# Patient Record
Sex: Female | Born: 1955
Health system: Southern US, Community
[De-identification: ages and names within clinical notes are randomized; demographics above are authoritative.]

## PROBLEM LIST (undated history)

## (undated) DIAGNOSIS — C801 Malignant (primary) neoplasm, unspecified: Secondary | ICD-10-CM

## (undated) DIAGNOSIS — M8580 Other specified disorders of bone density and structure, unspecified site: Secondary | ICD-10-CM

## (undated) DIAGNOSIS — T7840XA Allergy, unspecified, initial encounter: Secondary | ICD-10-CM

## (undated) DIAGNOSIS — E7800 Pure hypercholesterolemia, unspecified: Secondary | ICD-10-CM

## (undated) DIAGNOSIS — I10 Essential (primary) hypertension: Secondary | ICD-10-CM

## (undated) HISTORY — DX: Allergy, unspecified, initial encounter: T78.40XA

## (undated) HISTORY — DX: Essential (primary) hypertension: I10

## (undated) HISTORY — DX: Malignant (primary) neoplasm, unspecified: C80.1

## (undated) HISTORY — DX: Pure hypercholesterolemia, unspecified: E78.00

## (undated) HISTORY — DX: Other specified disorders of bone density and structure, unspecified site: M85.80

## (undated) HISTORY — PX: OTHER SURGICAL HISTORY: SHX169

## (undated) HISTORY — PX: TONSILLECTOMY: SUR1361

---

## 1998-10-30 ENCOUNTER — Other Ambulatory Visit: Admission: RE | Admit: 1998-10-30 | Discharge: 1998-10-30 | Payer: Self-pay | Admitting: Gynecology

## 1999-06-17 HISTORY — PX: CERVICAL BIOPSY  W/ LOOP ELECTRODE EXCISION: SUR135

## 1999-11-26 ENCOUNTER — Other Ambulatory Visit: Admission: RE | Admit: 1999-11-26 | Discharge: 1999-11-26 | Payer: Self-pay | Admitting: Gynecology

## 1999-12-20 ENCOUNTER — Other Ambulatory Visit: Admission: RE | Admit: 1999-12-20 | Discharge: 1999-12-20 | Payer: Self-pay | Admitting: Gynecology

## 1999-12-20 ENCOUNTER — Encounter (INDEPENDENT_AMBULATORY_CARE_PROVIDER_SITE_OTHER): Payer: Self-pay

## 2000-07-29 ENCOUNTER — Ambulatory Visit (HOSPITAL_COMMUNITY): Admission: RE | Admit: 2000-07-29 | Discharge: 2000-07-29 | Payer: Self-pay | Admitting: Family Medicine

## 2000-07-29 ENCOUNTER — Encounter: Payer: Self-pay | Admitting: Family Medicine

## 2000-12-04 ENCOUNTER — Other Ambulatory Visit: Admission: RE | Admit: 2000-12-04 | Discharge: 2000-12-04 | Payer: Self-pay | Admitting: Gynecology

## 2001-10-08 ENCOUNTER — Encounter: Payer: Self-pay | Admitting: Gynecology

## 2001-10-08 ENCOUNTER — Ambulatory Visit (HOSPITAL_COMMUNITY): Admission: RE | Admit: 2001-10-08 | Discharge: 2001-10-08 | Payer: Self-pay | Admitting: Gynecology

## 2002-05-23 ENCOUNTER — Other Ambulatory Visit: Admission: RE | Admit: 2002-05-23 | Discharge: 2002-05-23 | Payer: Self-pay | Admitting: Gynecology

## 2002-11-25 ENCOUNTER — Encounter: Payer: Self-pay | Admitting: Gynecology

## 2002-11-25 ENCOUNTER — Encounter: Admission: RE | Admit: 2002-11-25 | Discharge: 2002-11-25 | Payer: Self-pay | Admitting: Gynecology

## 2004-04-04 ENCOUNTER — Encounter: Admission: RE | Admit: 2004-04-04 | Discharge: 2004-04-04 | Payer: Self-pay | Admitting: Gynecology

## 2004-04-18 ENCOUNTER — Other Ambulatory Visit: Admission: RE | Admit: 2004-04-18 | Discharge: 2004-04-18 | Payer: Self-pay | Admitting: Gynecology

## 2005-04-29 ENCOUNTER — Other Ambulatory Visit: Admission: RE | Admit: 2005-04-29 | Discharge: 2005-04-29 | Payer: Self-pay | Admitting: Gynecology

## 2005-05-05 ENCOUNTER — Encounter: Admission: RE | Admit: 2005-05-05 | Discharge: 2005-05-05 | Payer: Self-pay | Admitting: Gynecology

## 2005-05-26 ENCOUNTER — Encounter: Admission: RE | Admit: 2005-05-26 | Discharge: 2005-05-26 | Payer: Self-pay | Admitting: Gynecology

## 2006-08-11 ENCOUNTER — Encounter: Admission: RE | Admit: 2006-08-11 | Discharge: 2006-08-11 | Payer: Self-pay | Admitting: Gynecology

## 2006-08-25 ENCOUNTER — Other Ambulatory Visit: Admission: RE | Admit: 2006-08-25 | Discharge: 2006-08-25 | Payer: Self-pay | Admitting: Gynecology

## 2007-11-05 ENCOUNTER — Encounter: Admission: RE | Admit: 2007-11-05 | Discharge: 2007-11-05 | Payer: Self-pay | Admitting: Gynecology

## 2007-11-18 ENCOUNTER — Other Ambulatory Visit: Admission: RE | Admit: 2007-11-18 | Discharge: 2007-11-18 | Payer: Self-pay | Admitting: Gynecology

## 2008-05-15 ENCOUNTER — Ambulatory Visit: Payer: Self-pay | Admitting: Internal Medicine

## 2008-05-25 ENCOUNTER — Encounter: Payer: Self-pay | Admitting: Internal Medicine

## 2008-05-25 ENCOUNTER — Ambulatory Visit: Payer: Self-pay | Admitting: Internal Medicine

## 2008-05-29 ENCOUNTER — Encounter: Payer: Self-pay | Admitting: Internal Medicine

## 2008-11-20 ENCOUNTER — Encounter: Admission: RE | Admit: 2008-11-20 | Discharge: 2008-11-20 | Payer: Self-pay | Admitting: Gynecology

## 2008-12-27 ENCOUNTER — Encounter: Payer: Self-pay | Admitting: Gynecology

## 2008-12-27 ENCOUNTER — Other Ambulatory Visit: Admission: RE | Admit: 2008-12-27 | Discharge: 2008-12-27 | Payer: Self-pay | Admitting: Gynecology

## 2008-12-27 ENCOUNTER — Ambulatory Visit: Payer: Self-pay | Admitting: Gynecology

## 2008-12-29 ENCOUNTER — Ambulatory Visit: Payer: Self-pay | Admitting: Gynecology

## 2009-11-27 ENCOUNTER — Encounter: Admission: RE | Admit: 2009-11-27 | Discharge: 2009-11-27 | Payer: Self-pay | Admitting: Gynecology

## 2009-12-28 ENCOUNTER — Ambulatory Visit: Payer: Self-pay | Admitting: Gynecology

## 2009-12-28 ENCOUNTER — Other Ambulatory Visit: Admission: RE | Admit: 2009-12-28 | Discharge: 2009-12-28 | Payer: Self-pay | Admitting: Gynecology

## 2010-07-07 ENCOUNTER — Encounter: Payer: Self-pay | Admitting: Gynecology

## 2010-11-13 ENCOUNTER — Other Ambulatory Visit: Payer: Self-pay | Admitting: Gynecology

## 2010-11-13 DIAGNOSIS — Z1231 Encounter for screening mammogram for malignant neoplasm of breast: Secondary | ICD-10-CM

## 2010-11-29 ENCOUNTER — Ambulatory Visit
Admission: RE | Admit: 2010-11-29 | Discharge: 2010-11-29 | Disposition: A | Discharge status: Home / Self Care | Payer: BC Managed Care – PPO | Source: Ambulatory Visit | Attending: Gynecology | Admitting: Gynecology

## 2010-11-29 DIAGNOSIS — Z1231 Encounter for screening mammogram for malignant neoplasm of breast: Secondary | ICD-10-CM

## 2010-12-30 ENCOUNTER — Encounter (INDEPENDENT_AMBULATORY_CARE_PROVIDER_SITE_OTHER): Payer: 59 | Admitting: Gynecology

## 2010-12-30 ENCOUNTER — Other Ambulatory Visit (HOSPITAL_COMMUNITY)
Admission: RE | Admit: 2010-12-30 | Discharge: 2010-12-30 | Disposition: A | Discharge status: Home / Self Care | Payer: 59 | Source: Ambulatory Visit | Attending: Gynecology | Admitting: Gynecology

## 2010-12-30 ENCOUNTER — Other Ambulatory Visit: Payer: Self-pay | Admitting: Gynecology

## 2010-12-30 DIAGNOSIS — IMO0002 Reserved for concepts with insufficient information to code with codable children: Secondary | ICD-10-CM

## 2010-12-30 DIAGNOSIS — Z124 Encounter for screening for malignant neoplasm of cervix: Secondary | ICD-10-CM | POA: Insufficient documentation

## 2010-12-30 DIAGNOSIS — Z01419 Encounter for gynecological examination (general) (routine) without abnormal findings: Secondary | ICD-10-CM

## 2011-11-04 ENCOUNTER — Other Ambulatory Visit: Payer: Self-pay | Admitting: Gynecology

## 2011-11-04 DIAGNOSIS — Z1231 Encounter for screening mammogram for malignant neoplasm of breast: Secondary | ICD-10-CM

## 2011-12-09 ENCOUNTER — Ambulatory Visit
Admission: RE | Admit: 2011-12-09 | Discharge: 2011-12-09 | Disposition: A | Discharge status: Home / Self Care | Payer: 59 | Source: Ambulatory Visit | Attending: Gynecology | Admitting: Gynecology

## 2011-12-09 DIAGNOSIS — Z1231 Encounter for screening mammogram for malignant neoplasm of breast: Secondary | ICD-10-CM

## 2012-02-17 ENCOUNTER — Encounter: Payer: Self-pay | Admitting: Gynecology

## 2012-02-17 ENCOUNTER — Ambulatory Visit (INDEPENDENT_AMBULATORY_CARE_PROVIDER_SITE_OTHER): Payer: PRIVATE HEALTH INSURANCE | Admitting: Gynecology

## 2012-02-17 VITALS — BP 128/74 | Ht 62.0 in | Wt 131.0 lb

## 2012-02-17 DIAGNOSIS — N926 Irregular menstruation, unspecified: Secondary | ICD-10-CM

## 2012-02-17 DIAGNOSIS — N951 Menopausal and female climacteric states: Secondary | ICD-10-CM

## 2012-02-17 DIAGNOSIS — Z01419 Encounter for gynecological examination (general) (routine) without abnormal findings: Secondary | ICD-10-CM

## 2012-02-17 NOTE — Patient Instructions (Signed)
Continue with over-the-counter soy for hot flashes. If become significantly bothersome follow up for hormone replacement discussion. Otherwise follow up in one year for annual exam.

## 2012-02-17 NOTE — Progress Notes (Signed)
Judy Arellano 11/27/1955 [id number]        56 y.o.  G2P2 for annual exam.  Several issues noted below.  Past medical history,surgical history, medications, allergies, family history and social history were all reviewed and documented in the EPIC chart. ROS:  Was performed and pertinent positives and negatives are included in the history.  Exam: Sherrilyn Rist assistant Filed Vitals:   02/17/12 1025  BP: 128/74  Height: 5\' 2"  (1.575 m)  Weight: 131 lb (59.421 kg)   General appearance  Normal Skin grossly normal Head/Neck normal with no cervical or supraclavicular adenopathy thyroid normal Lungs  clear Cardiac RR, without RMG Abdominal  soft, nontender, without masses, organomegaly or hernia Breasts  examined lying and sitting without masses, retractions, discharge or axillary adenopathy. Pelvic  Ext/BUS/vagina  normal   Cervix  normal   Uterus  retroverted, mild descent,normal size, shape and contour, midline and mobile nontender   Adnexa  Without masses or tenderness    Anus and perineum  normal   Rectovaginal  normal sphincter tone without palpated masses or tenderness.    Assessment/Plan:  56 y.o. G2P2 female for annual exam, vasectomy birth control.   1. Irregular menses/menopausal symptoms. Patient had 2 menses last year. Both were regular when they came. She also notes hot flushes throughout the day. No real night sweats or sleep disturbances. Has tried OTC soy with some success. Reviewed options up to and including HRT. Patient's not interested. She'll continue to use OTC soy. Will keep menstrual calendar. As long as less frequent but regular menses when they occur we'll monitor, if prolonged or atypical bleeding or goes more than one year and then has bleeding she knows to call me. 2. Pap smear. Last Pap smear 2012. No Pap smear done today. No history of abnormal Pap smears with multiple normal reports in her chart. We'll plan every 3-5 year screening.  She does have a history of the  LEEP but this was for a nabothian cyst and not an abnormal Pap smear. 3. Mammography. Patient had June 2013. We'll continue with annual mammography. SBE monthly reviewed. 4. Colonoscopy. Patient had 3 years ago and due to repeated 5 years due to polyps. 5. DEXA. We'll plan baseline further into the menopause. 6. Health maintenance. The blood work done this is all done through her primary physician who follows her for her hypertension and hypercholesterolemia. Follow up one year, sooner as needed.    Dara Lords MD, 10:55 AM 02/17/2012

## 2012-02-18 LAB — URINALYSIS W MICROSCOPIC + REFLEX CULTURE
Bilirubin Urine: NEGATIVE
Casts: NONE SEEN
Crystals: NONE SEEN
Ketones, ur: NEGATIVE mg/dL
Specific Gravity, Urine: 1.008 (ref 1.005–1.030)
Squamous Epithelial / LPF: NONE SEEN
Urobilinogen, UA: 0.2 mg/dL (ref 0.0–1.0)
pH: 7.5 (ref 5.0–8.0)

## 2012-03-17 ENCOUNTER — Other Ambulatory Visit: Payer: Self-pay | Admitting: Dermatology

## 2012-12-22 ENCOUNTER — Other Ambulatory Visit: Payer: Self-pay

## 2012-12-22 DIAGNOSIS — Z1231 Encounter for screening mammogram for malignant neoplasm of breast: Secondary | ICD-10-CM

## 2013-01-11 ENCOUNTER — Ambulatory Visit: Payer: PRIVATE HEALTH INSURANCE

## 2013-01-26 ENCOUNTER — Ambulatory Visit
Admission: RE | Admit: 2013-01-26 | Discharge: 2013-01-26 | Disposition: A | Discharge status: Home / Self Care | Payer: PRIVATE HEALTH INSURANCE | Source: Ambulatory Visit

## 2013-01-26 DIAGNOSIS — Z1231 Encounter for screening mammogram for malignant neoplasm of breast: Secondary | ICD-10-CM

## 2013-02-17 ENCOUNTER — Ambulatory Visit (INDEPENDENT_AMBULATORY_CARE_PROVIDER_SITE_OTHER): Payer: No Typology Code available for payment source | Admitting: Gynecology

## 2013-02-17 ENCOUNTER — Encounter: Payer: Self-pay | Admitting: Gynecology

## 2013-02-17 VITALS — BP 120/74 | Ht 62.0 in | Wt 125.0 lb

## 2013-02-17 DIAGNOSIS — Z01419 Encounter for gynecological examination (general) (routine) without abnormal findings: Secondary | ICD-10-CM

## 2013-02-17 NOTE — Patient Instructions (Signed)
Followup in one year for annual exam, sooner as needed. 

## 2013-02-17 NOTE — Progress Notes (Signed)
[place] Dec 21, 1955 [id number]        57 y.o.  G2P2 for annual exam.  Doing well without complaints.  Past medical history,surgical history, medications, allergies, family history and social history were all reviewed and documented in the EPIC chart.  ROS:  Performed and pertinent positives and negatives are included in the history, assessment and plan .  Exam: Kim assistant Filed Vitals:   02/17/13 0941  BP: 120/74  Height: 5\' 2"  (1.575 m)  Weight: 125 lb (56.7 kg)   General appearance  Normal Skin grossly normal Head/Neck normal with no cervical or supraclavicular adenopathy thyroid normal Lungs  clear Cardiac RR, without RMG Abdominal  soft, nontender, without masses, organomegaly or hernia Breasts  examined lying and sitting without masses, retractions, discharge or axillary adenopathy. Pelvic  Ext/BUS/vagina  normal  Cervix  normal  Uterus  retroverted, normal size, shape and contour, midline and mobile nontender   Adnexa  Without masses or tenderness    Anus and perineum  normal   Rectovaginal  normal sphincter tone without palpated masses or tenderness.    Assessment/Plan:  57 y.o. G2P2 female for annual exam.   1. Postmenopausal. LMP 1-1/2 years ago. No bleeding since then. Some mild hot flushes but getting better. Will continue to monitor. Patient knows to report any bleeding. 2. Pap smear 2012. No Pap smear done today. No history of abnormal Pap smears previously. Plan repeat Pap smear next year 3 year interval. 3. Mammography 01/2013. Continued annual mammography. SBE monthly reviewed. 4. DEXA never. With later menopausal will plan baseline at age 77. Increase calcium and vitamin D reviewed. 5. Colonoscopy due this year. History of polyps in the past. Patient knows to call and schedule. 6. Health maintenance. No blood work done as it is all done through her primary physician's office. Followup one year, sooner as needed.  Note: This document was prepared with  digital dictation and possible smart phrase technology. Any transcriptional errors that result from this process are unintentional.   Dara Lords MD, 9:57 AM 02/17/2013

## 2013-02-18 LAB — URINALYSIS W MICROSCOPIC + REFLEX CULTURE
Bacteria, UA: NONE SEEN
Bilirubin Urine: NEGATIVE
Casts: NONE SEEN
Crystals: NONE SEEN
Hgb urine dipstick: NEGATIVE
Ketones, ur: NEGATIVE mg/dL
Specific Gravity, Urine: 1.014 (ref 1.005–1.030)
Urobilinogen, UA: 0.2 mg/dL (ref 0.0–1.0)
pH: 6.5 (ref 5.0–8.0)

## 2013-03-07 ENCOUNTER — Encounter: Payer: Self-pay | Admitting: Internal Medicine

## 2013-07-11 ENCOUNTER — Encounter: Payer: Self-pay | Admitting: Internal Medicine

## 2013-08-26 ENCOUNTER — Ambulatory Visit (AMBULATORY_SURGERY_CENTER): Payer: No Typology Code available for payment source | Admitting: *Deleted

## 2013-08-26 VITALS — Ht 62.0 in | Wt 129.8 lb

## 2013-08-26 DIAGNOSIS — Z86010 Personal history of colonic polyps: Secondary | ICD-10-CM

## 2013-08-26 MED ORDER — MOVIPREP 100 G PO SOLR: 1.0000 | Freq: Once | ORAL | Status: DC

## 2013-08-26 NOTE — Progress Notes (Signed)
No egg or soy allergy. No anesthesia problems.  

## 2013-09-02 ENCOUNTER — Encounter: Payer: Self-pay | Admitting: Internal Medicine

## 2013-09-09 ENCOUNTER — Encounter: Payer: PRIVATE HEALTH INSURANCE | Admitting: Internal Medicine

## 2013-11-12 ENCOUNTER — Encounter: Payer: Self-pay | Admitting: Internal Medicine

## 2013-11-17 ENCOUNTER — Telehealth: Payer: Self-pay | Admitting: Internal Medicine

## 2013-11-17 NOTE — Telephone Encounter (Signed)
pt just wanted [place] to know she has had her colonoscopy done at another facility and has moved her GI care

## 2014-01-24 ENCOUNTER — Other Ambulatory Visit: Payer: Self-pay

## 2014-01-24 DIAGNOSIS — Z1231 Encounter for screening mammogram for malignant neoplasm of breast: Secondary | ICD-10-CM

## 2014-02-02 ENCOUNTER — Ambulatory Visit
Admission: RE | Admit: 2014-02-02 | Discharge: 2014-02-02 | Disposition: A | Discharge status: Home / Self Care | Payer: No Typology Code available for payment source | Source: Ambulatory Visit

## 2014-02-02 DIAGNOSIS — Z1231 Encounter for screening mammogram for malignant neoplasm of breast: Secondary | ICD-10-CM

## 2014-02-10 ENCOUNTER — Encounter: Payer: Self-pay | Admitting: Internal Medicine

## 2014-03-16 ENCOUNTER — Other Ambulatory Visit (HOSPITAL_COMMUNITY)
Admission: RE | Admit: 2014-03-16 | Discharge: 2014-03-16 | Disposition: A | Discharge status: Home / Self Care | Payer: No Typology Code available for payment source | Source: Ambulatory Visit | Attending: Gynecology | Admitting: Gynecology

## 2014-03-16 ENCOUNTER — Encounter: Payer: Self-pay | Admitting: Gynecology

## 2014-03-16 ENCOUNTER — Ambulatory Visit (INDEPENDENT_AMBULATORY_CARE_PROVIDER_SITE_OTHER): Payer: No Typology Code available for payment source | Admitting: Gynecology

## 2014-03-16 VITALS — BP 128/80 | Ht 62.0 in | Wt 127.0 lb

## 2014-03-16 DIAGNOSIS — Z1151 Encounter for screening for human papillomavirus (HPV): Secondary | ICD-10-CM | POA: Diagnosis present

## 2014-03-16 DIAGNOSIS — Z124 Encounter for screening for malignant neoplasm of cervix: Secondary | ICD-10-CM

## 2014-03-16 DIAGNOSIS — L918 Other hypertrophic disorders of the skin: Secondary | ICD-10-CM

## 2014-03-16 DIAGNOSIS — G4700 Insomnia, unspecified: Secondary | ICD-10-CM

## 2014-03-16 DIAGNOSIS — Z01419 Encounter for gynecological examination (general) (routine) without abnormal findings: Secondary | ICD-10-CM

## 2014-03-16 NOTE — Patient Instructions (Signed)
Office will call you with biopsy results.  You may obtain a copy of any labs that were done today by logging onto MyChart as outlined in the instructions provided with your AVS (after visit summary). The office will not call with normal lab results but certainly if there are any significant abnormalities then we will contact you.   Health Maintenance, Female A healthy lifestyle and preventative care can promote health and wellness.  Maintain regular health, dental, and eye exams.  Eat a healthy diet. Foods like vegetables, fruits, whole grains, low-fat dairy products, and lean protein foods contain the nutrients you need without too many calories. Decrease your intake of foods high in solid fats, added sugars, and salt. Get information about a proper diet from your caregiver, if necessary.  Regular physical exercise is one of the most important things you can do for your health. Most adults should get at least 150 minutes of moderate-intensity exercise (any activity that increases your heart rate and causes you to sweat) each week. In addition, most adults need muscle-strengthening exercises on 2 or more days a week.   Maintain a healthy weight. The body mass index (BMI) is a screening tool to identify possible weight problems. It provides an estimate of body fat based on height and weight. Your caregiver can help determine your BMI, and can help you achieve or maintain a healthy weight. For adults 20 years and older:  A BMI below 18.5 is considered underweight.  A BMI of 18.5 to 24.9 is normal.  A BMI of 25 to 29.9 is considered overweight.  A BMI of 30 and above is considered obese.  Maintain normal blood lipids and cholesterol by exercising and minimizing your intake of saturated fat. Eat a balanced diet with plenty of fruits and vegetables. Blood tests for lipids and cholesterol should begin at age 20 and be repeated every 5 years. If your lipid or cholesterol levels are high, you are  over 50, or you are a high risk for heart disease, you may need your cholesterol levels checked more frequently.Ongoing high lipid and cholesterol levels should be treated with medicines if diet and exercise are not effective.  If you smoke, find out from your caregiver how to quit. If you do not use tobacco, do not start.  Lung cancer screening is recommended for adults aged 55 80 years who are at high risk for developing lung cancer because of a history of smoking. Yearly low-dose computed tomography (CT) is recommended for people who have at least a 30-pack-year history of smoking and are a current smoker or have quit within the past 15 years. A pack year of smoking is smoking an average of 1 pack of cigarettes a day for 1 year (for example: 1 pack a day for 30 years or 2 packs a day for 15 years). Yearly screening should continue until the smoker has stopped smoking for at least 15 years. Yearly screening should also be stopped for people who develop a health problem that would prevent them from having lung cancer treatment.  If you are pregnant, do not drink alcohol. If you are breastfeeding, be very cautious about drinking alcohol. If you are not pregnant and choose to drink alcohol, do not exceed 1 drink per day. One drink is considered to be 12 ounces (355 mL) of beer, 5 ounces (148 mL) of wine, or 1.5 ounces (44 mL) of liquor.  Avoid use of street drugs. Do not share needles with anyone. Ask for help   if you need support or instructions about stopping the use of drugs.  High blood pressure causes heart disease and increases the risk of stroke. Blood pressure should be checked at least every 1 to 2 years. Ongoing high blood pressure should be treated with medicines, if weight loss and exercise are not effective.  If you are 55 to 57 years old, ask your caregiver if you should take aspirin to prevent strokes.  Diabetes screening involves taking a blood sample to check your fasting blood sugar level.  This should be done once every 3 years, after age 45, if you are within normal weight and without risk factors for diabetes. Testing should be considered at a younger age or be carried out more frequently if you are overweight and have at least 1 risk factor for diabetes.  Breast cancer screening is essential preventative care for women. You should practice "breast self-awareness." This means understanding the normal appearance and feel of your breasts and may include breast self-examination. Any changes detected, no matter how small, should be reported to a caregiver. Women in their 20s and 30s should have a clinical breast exam (CBE) by a caregiver as part of a regular health exam every 1 to 3 years. After age 40, women should have a CBE every year. Starting at age 40, women should consider having a mammogram (breast X-ray) every year. Women who have a family history of breast cancer should talk to their caregiver about genetic screening. Women at a high risk of breast cancer should talk to their caregiver about having an MRI and a mammogram every year.  Breast cancer gene (BRCA)-related cancer risk assessment is recommended for women who have family members with BRCA-related cancers. BRCA-related cancers include breast, ovarian, tubal, and peritoneal cancers. Having family members with these cancers may be associated with an increased risk for harmful changes (mutations) in the breast cancer genes BRCA1 and BRCA2. Results of the assessment will determine the need for genetic counseling and BRCA1 and BRCA2 testing.  The Pap test is a screening test for cervical cancer. Women should have a Pap test starting at age 21. Between ages 21 and 29, Pap tests should be repeated every 2 years. Beginning at age 30, you should have a Pap test every 3 years as long as the past 3 Pap tests have been normal. If you had a hysterectomy for a problem that was not cancer or a condition that could lead to cancer, then you no  longer need Pap tests. If you are between ages 65 and 70, and you have had normal Pap tests going back 10 years, you no longer need Pap tests. If you have had past treatment for cervical cancer or a condition that could lead to cancer, you need Pap tests and screening for cancer for at least 20 years after your treatment. If Pap tests have been discontinued, risk factors (such as a new sexual partner) need to be reassessed to determine if screening should be resumed. Some women have medical problems that increase the chance of getting cervical cancer. In these cases, your caregiver may recommend more frequent screening and Pap tests.  The human papillomavirus (HPV) test is an additional test that may be used for cervical cancer screening. The HPV test looks for the virus that can cause the cell changes on the cervix. The cells collected during the Pap test can be tested for HPV. The HPV test could be used to screen women aged 30 years and older, and   should be used in women of any age who have unclear Pap test results. After the age of 7, women should have HPV testing at the same frequency as a Pap test.  Colorectal cancer can be detected and often prevented. Most routine colorectal cancer screening begins at the age of 20 and continues through age 36. However, your caregiver may recommend screening at an earlier age if you have risk factors for colon cancer. On a yearly basis, your caregiver may provide home test kits to check for hidden blood in the stool. Use of a small camera at the end of a tube, to directly examine the colon (sigmoidoscopy or colonoscopy), can detect the earliest forms of colorectal cancer. Talk to your caregiver about this at age 46, when routine screening begins. Direct examination of the colon should be repeated every 5 to 10 years through age 62, unless early forms of pre-cancerous polyps or small growths are found.  Hepatitis C blood testing is recommended for all people born  from [birth year] through 1964 and any individual with known risks for hepatitis C.  Practice safe sex. Use condoms and avoid high-risk sexual practices to reduce the spread of sexually transmitted infections (STIs). Sexually active women aged 55 and younger should be checked for Chlamydia, which is a common sexually transmitted infection. Older women with new or multiple partners should also be tested for Chlamydia. Testing for other STIs is recommended if you are sexually active and at increased risk.  Osteoporosis is a disease in which the bones lose minerals and strength with aging. This can result in serious bone fractures. The risk of osteoporosis can be identified using a bone density scan. Women ages 31 and over and women at risk for fractures or osteoporosis should discuss screening with their caregivers. Ask your caregiver whether you should be taking a calcium supplement or vitamin D to reduce the rate of osteoporosis.  Menopause can be associated with physical symptoms and risks. Hormone replacement therapy is available to decrease symptoms and risks. You should talk to your caregiver about whether hormone replacement therapy is right for you.  Use sunscreen. Apply sunscreen liberally and repeatedly throughout the day. You should seek shade when your shadow is shorter than you. Protect yourself by wearing long sleeves, pants, a wide-brimmed hat, and sunglasses year round, whenever you are outdoors.  Notify your caregiver of new moles or changes in moles, especially if there is a change in shape or color. Also notify your caregiver if a mole is larger than the size of a pencil eraser.  Stay current with your immunizations. Document Released: 12/16/2010 Document Revised: 09/27/2012 Document Reviewed: 12/16/2010 [facility] Patient Information 2014 [place].

## 2014-03-16 NOTE — Progress Notes (Signed)
Judy Arellano [birth year] [id number]        58 y.o.  G2P2 for annual exam.  Doing well.  Past medical history,surgical history, problem list, medications, allergies, family history and social history were all reviewed and documented as reviewed in the EPIC chart.  ROS:  12 system ROS performed with pertinent positives and negatives included in the history, assessment and plan.   Additional significant findings :  Difficulty falling asleep and staying asleep.   Exam: Kim assistant Filed Vitals:   03/16/14 1056  BP: 128/80  Height: 5\' 2"  (1.575 m)  Weight: 127 lb (57.607 kg)   General appearance:  Normal affect, orientation and appearance. Skin: Grossly normal HEENT: Without gross lesions.  No cervical or supraclavicular adenopathy. Thyroid normal.  Lungs:  Clear without wheezing, rales or rhonchi Cardiac: RR, without RMG Abdominal:  Soft, nontender, without masses, guarding, rebound, organomegaly or hernia Breasts:  Examined lying and sitting without masses, retractions, discharge or axillary adenopathy. Pelvic:  Ext/BUS/vagina with generalized atrophic changes. Pedunculated skin tag left lower bolus/perineal body. Separate skin tag left lower buttocks that junction with perineum Physical Exam  Genitourinary:     Procedure: The skin overlying both of these skin tags was cleansed with Betadine, infiltrated with 1% lidocaine and both skin tags excised at the level of the surrounding skin in their entirety. Both sent to pathology separately. Silver nitrate hemostasis applied afterwards with sterile dressing. Postop instructions reviewed.    Cervix normal. Pap/HPV  Uterus anteverted, normal size, shape and contour, midline and mobile nontender   Adnexa  Without masses or tenderness    Anus and perineum  Normal   Rectovaginal  Normal sphincter tone without palpated masses or tenderness.    Assessment/Plan:  58 y.o. G2P2 G2P2 female for annual exam.   1. Skin tags, removed as above.  Patient will follow up with pathology results. 2. Postmenopausal/atrophic genital changes. Patient having some hot flashes and night sweats but overall tolerable. No vaginal bleeding. Will continue to monitor. Discussed possible HRT but at this point not interested. Report any vaginal bleeding. 3. Difficulty falling asleep. Does use trazodone sometimes as well as Benadryl. Options of HRT trial reviewed. Ultimately decided to hold on that and continue with what she is doing. 4. Pap smear 2012. History of LEEP 2001 with normal Pap smears since. Pap/HPV today. 5. Mammography 01/2014.  Continue with annual mammography. SBE monthly reviewed. 6. Colonoscopy 2015. Repeat at their recommended interval. 7. DEXA never. Plan at age 65. Increased calcium vitamin D reviewed. 8. Health maintenance. No routine blood work done as she reports this done at her primary physician's office. Follow up one year, sooner as needed.     Anastasio Auerbach MD, 11:26 AM 03/16/2014

## 2014-03-16 NOTE — Addendum Note (Signed)
Addended by: Nelva Nay on: 03/16/2014 11:53 AM   Modules accepted: Orders

## 2014-03-17 LAB — URINALYSIS W MICROSCOPIC + REFLEX CULTURE
BACTERIA UA: NONE SEEN
BILIRUBIN URINE: NEGATIVE
CRYSTALS: NONE SEEN
Casts: NONE SEEN
Glucose, UA: NEGATIVE mg/dL
Hgb urine dipstick: NEGATIVE
KETONES UR: NEGATIVE mg/dL
Leukocytes, UA: NEGATIVE
NITRITE: NEGATIVE
PH: 7.5 (ref 5.0–8.0)
Protein, ur: NEGATIVE mg/dL
Specific Gravity, Urine: 1.015 (ref 1.005–1.030)
Squamous Epithelial / LPF: NONE SEEN
Urobilinogen, UA: 0.2 mg/dL (ref 0.0–1.0)

## 2014-03-17 LAB — CYTOLOGY - PAP

## 2014-03-31 ENCOUNTER — Other Ambulatory Visit: Payer: Self-pay | Admitting: Family Medicine

## 2014-04-06 ENCOUNTER — Other Ambulatory Visit: Payer: Self-pay | Admitting: Family Medicine

## 2014-04-17 ENCOUNTER — Encounter: Payer: Self-pay | Admitting: Gynecology

## 2014-04-21 ENCOUNTER — Other Ambulatory Visit: Payer: Self-pay | Admitting: Family Medicine

## 2014-04-21 DIAGNOSIS — E7800 Pure hypercholesterolemia, unspecified: Secondary | ICD-10-CM

## 2014-04-21 DIAGNOSIS — Z8249 Family history of ischemic heart disease and other diseases of the circulatory system: Secondary | ICD-10-CM

## 2014-05-02 ENCOUNTER — Ambulatory Visit
Admission: RE | Admit: 2014-05-02 | Discharge: 2014-05-02 | Disposition: A | Discharge status: Home / Self Care | Payer: No Typology Code available for payment source | Source: Ambulatory Visit | Attending: Family Medicine | Admitting: Family Medicine

## 2014-05-02 DIAGNOSIS — E7800 Pure hypercholesterolemia, unspecified: Secondary | ICD-10-CM

## 2014-05-02 DIAGNOSIS — Z8249 Family history of ischemic heart disease and other diseases of the circulatory system: Secondary | ICD-10-CM

## 2014-08-14 ENCOUNTER — Ambulatory Visit (INDEPENDENT_AMBULATORY_CARE_PROVIDER_SITE_OTHER): Payer: No Typology Code available for payment source | Admitting: Gynecology

## 2014-08-14 ENCOUNTER — Encounter: Payer: Self-pay | Admitting: Gynecology

## 2014-08-14 VITALS — BP 116/60

## 2014-08-14 DIAGNOSIS — N950 Postmenopausal bleeding: Secondary | ICD-10-CM

## 2014-08-14 NOTE — Patient Instructions (Signed)
Follow up for ultrasound as scheduled 

## 2014-08-14 NOTE — Progress Notes (Signed)
Judy Arellano 1956/03/29 [id number]        59 y.o.  G2P2 presents with one episode of postmenopausal bleeding. Last menstrual period 3 years ago. No bleeding since until 2 days ago when she had an episode of light menstrual like leading lasting less than half a day.  Did have premenstrual type bloating and cramping preceding it. Bleeding has subsequently resolved.  Past medical history,surgical history, problem list, medications, allergies, family history and social history were all reviewed and documented in the EPIC chart.  Directed ROS with pertinent positives and negatives documented in the history of present illness/assessment and plan.  Exam: Kim assistant General appearance:  Normal Abdomen soft nontender without masses guarding rebound External BUS vagina with atrophic changes. No bleeding. Cervix normal without bleeding. Uterus retroverted normal size midline mobile nontender. Adnexa without masses or tenderness.  Assessment/Plan:  58 y.o. G2P2 with single episode of postmenopausal bleeding. Patient does give a history of having endometrial polyps in the past. Will start with sonohysterogram to rule out structural abnormalities in allow for endometrial sampling. It sounds as if she had an abortive population with the premenstrual type symptoms. Assuming sonohysterogram study is negative and plan expected management and follow up if recurrent episodes of bleeding.     Anastasio Auerbach MD, 2:51 PM 08/14/2014

## 2014-08-28 ENCOUNTER — Other Ambulatory Visit: Payer: Self-pay | Admitting: Gynecology

## 2014-08-28 DIAGNOSIS — N950 Postmenopausal bleeding: Secondary | ICD-10-CM

## 2014-09-11 ENCOUNTER — Telehealth: Payer: Self-pay | Admitting: Gynecology

## 2014-09-11 NOTE — Telephone Encounter (Signed)
09/11/14-Pt was informed today that her [employer] ins covers the [place] under her $60.00 spec copay but the 58340 and the 58100 are put towards her $3,000 deductible(only $266.20 met so far). The ins allowance for the 58340 is $319.06 and for the 58100 it is $236.85. She will talk to her husband and let us know this week if she wants to proceed or what they can pay upfront.wl Test is scheduled for 09/20/14.

## 2014-09-20 ENCOUNTER — Ambulatory Visit (INDEPENDENT_AMBULATORY_CARE_PROVIDER_SITE_OTHER): Payer: No Typology Code available for payment source

## 2014-09-20 ENCOUNTER — Ambulatory Visit (INDEPENDENT_AMBULATORY_CARE_PROVIDER_SITE_OTHER): Payer: No Typology Code available for payment source | Admitting: Gynecology

## 2014-09-20 ENCOUNTER — Other Ambulatory Visit: Payer: Self-pay | Admitting: Gynecology

## 2014-09-20 ENCOUNTER — Encounter: Payer: Self-pay | Admitting: Gynecology

## 2014-09-20 VITALS — BP 130/70

## 2014-09-20 DIAGNOSIS — N950 Postmenopausal bleeding: Secondary | ICD-10-CM

## 2014-09-20 DIAGNOSIS — D251 Intramural leiomyoma of uterus: Secondary | ICD-10-CM | POA: Diagnosis not present

## 2014-09-20 NOTE — Patient Instructions (Signed)
Office will call you with biopsy results. Report any further bleeding once you stop bleeding from the ultrasound exam.

## 2014-09-20 NOTE — Progress Notes (Signed)
Judy Arellano 07-30-1955 [id number]        59 y.o.  G2P2 presents for sonohysterogram due to episode of postmenopausal bleeding.  Past medical history,surgical history, problem list, medications, allergies, family history and social history were all reviewed and documented in the EPIC chart.  Directed ROS with pertinent positives and negatives documented in the history of present illness/assessment and plan.  Exam: [place] assistant Filed Vitals:   09/20/14 1249  BP: 130/70   General appearance:  Normal External BUS vagina with atrophic changes. Cervix grossly normal.  Uterus overall normal size. Endometrial echo 1.7 mm.  2 small myomas noted 40 mm and 16 mm. Right and left ovaries normal. Cul-de-sac negative.  Sonohysterogram performed, sterile technique, single tooth tenaculum anterior lip stabilization for catheter placement, good distention with no abnormalities.  Endometrial sample taken. Patient tolerated well.  Assessment/Plan:  59 y.o. G2P2 with episode of vaginal bleeding following intercourse. Reviewed with patient may be due to thin endometrium or possibly a vaginal abrasion. Assuming biopsy is normal or inadequate consistent with her thin endometrial echo  We will plan expected management and she'll report any further bleeding. Possible vaginal estrogen supplementation discussed if becomes a recurrent issue.    Anastasio Auerbach MD, 12:57 PM 09/20/2014

## 2015-02-01 ENCOUNTER — Other Ambulatory Visit: Payer: Self-pay

## 2015-02-01 DIAGNOSIS — Z1231 Encounter for screening mammogram for malignant neoplasm of breast: Secondary | ICD-10-CM

## 2015-03-13 ENCOUNTER — Ambulatory Visit
Admission: RE | Admit: 2015-03-13 | Discharge: 2015-03-13 | Disposition: A | Discharge status: Home / Self Care | Payer: 59 | Source: Ambulatory Visit

## 2015-03-13 DIAGNOSIS — Z1231 Encounter for screening mammogram for malignant neoplasm of breast: Secondary | ICD-10-CM

## 2015-03-27 ENCOUNTER — Encounter: Payer: Self-pay | Admitting: Gynecology

## 2015-03-27 ENCOUNTER — Ambulatory Visit (INDEPENDENT_AMBULATORY_CARE_PROVIDER_SITE_OTHER): Payer: 59 | Admitting: Gynecology

## 2015-03-27 VITALS — BP 114/70 | Ht 62.0 in | Wt 131.0 lb

## 2015-03-27 DIAGNOSIS — Z01419 Encounter for gynecological examination (general) (routine) without abnormal findings: Secondary | ICD-10-CM | POA: Diagnosis not present

## 2015-03-27 DIAGNOSIS — N952 Postmenopausal atrophic vaginitis: Secondary | ICD-10-CM | POA: Diagnosis not present

## 2015-03-27 NOTE — Patient Instructions (Signed)

## 2015-03-27 NOTE — Progress Notes (Signed)
Judy Arellano 07-20-1955 [id number]        59 y.o.  G2P2 for annual exam.  Doing well.  Past medical history,surgical history, problem list, medications, allergies, family history and social history were all reviewed and documented as reviewed in the EPIC chart.  ROS:  Performed with pertinent positives and negatives included in the history, assessment and plan.   Additional significant findings :  none   Exam: Kim Counsellor Vitals:   03/27/15 1449  BP: 114/70  Height: 5\' 2"  (1.575 m)  Weight: 131 lb (59.421 kg)   General appearance:  Normal affect, orientation and appearance. Skin: Grossly normal HEENT: Without gross lesions.  No cervical or supraclavicular adenopathy. Thyroid normal.  Lungs:  Clear without wheezing, rales or rhonchi Cardiac: RR, without RMG Abdominal:  Soft, nontender, without masses, guarding, rebound, organomegaly or hernia Breasts:  Examined lying and sitting without masses, retractions, discharge or axillary adenopathy. Pelvic:  Ext/BUS/vagina with mild atrophic changes  Cervix with mild atrophic changes  Uterus anteverted, normal size, shape and contour, midline and mobile nontender   Adnexa  Without masses or tenderness    Anus and perineum  Normal   Rectovaginal  Normal sphincter tone without palpated masses or tenderness.    Assessment/Plan:  59 y.o. G2P2 female for annual exam.   1. Postmenopausal/mild atrophic changes. Having some hot flushes and night sweats. Not to the degree that she wants to consider HRT. Will try OTC products such as soy based and see if this does not help. No vaginal bleeding. Was evaluated for postmenopausal bleeding earlier this year but has had no recurrence. Continue to follow and report any vaginal bleeding. Follow up if menopausal symptoms worsen and she wants to discuss HRT. 2. Pap smear/HPV 2015 negative. No Pap smear done today. History of LEEP 2001 for nabothian cysts but no abnormal Pap smear history. Plan repeat  Pap smear at 5 year interval per current screening guidelines. 3. Mammography 02/2015. Continue with annual mammography. SBE month are reviewed. 4. Colonoscopy 2015. Repeat at their recommended interval. 5. DEXA never. We'll plan at age 37. Increased calcium vitamin D.  6. Health maintenance. No routine blood work done as she has this done at her primary physician's office. Follow up 1 year, sooner as needed.   Anastasio Auerbach MD, 3:12 PM 03/27/2015

## 2015-03-28 LAB — URINALYSIS W MICROSCOPIC + REFLEX CULTURE
BACTERIA UA: NONE SEEN [HPF]
BILIRUBIN URINE: NEGATIVE
CRYSTALS: NONE SEEN [HPF]
Casts: NONE SEEN [LPF]
Glucose, UA: NEGATIVE
Hgb urine dipstick: NEGATIVE
Ketones, ur: NEGATIVE
Leukocytes, UA: NEGATIVE
Nitrite: NEGATIVE
PROTEIN: NEGATIVE
RBC / HPF: NONE SEEN RBC/HPF (ref <=2)
SPECIFIC GRAVITY, URINE: 1.024 (ref 1.001–1.035)
Squamous Epithelial / LPF: NONE SEEN [HPF] (ref <=5)
WBC UA: NONE SEEN WBC/HPF (ref <=5)
YEAST: NONE SEEN [HPF]
pH: 6 (ref 5.0–8.0)

## 2015-06-15 ENCOUNTER — Ambulatory Visit
Admission: RE | Admit: 2015-06-15 | Discharge: 2015-06-15 | Disposition: A | Discharge status: Home / Self Care | Payer: 59 | Source: Ambulatory Visit | Attending: Family Medicine | Admitting: Family Medicine

## 2015-06-15 ENCOUNTER — Other Ambulatory Visit: Payer: Self-pay | Admitting: Family Medicine

## 2015-06-15 DIAGNOSIS — S92502A Displaced unspecified fracture of left lesser toe(s), initial encounter for closed fracture: Secondary | ICD-10-CM

## 2016-02-14 ENCOUNTER — Other Ambulatory Visit: Payer: Self-pay | Admitting: Gynecology

## 2016-02-14 DIAGNOSIS — Z1231 Encounter for screening mammogram for malignant neoplasm of breast: Secondary | ICD-10-CM

## 2016-03-13 ENCOUNTER — Ambulatory Visit
Admission: RE | Admit: 2016-03-13 | Discharge: 2016-03-13 | Disposition: A | Discharge status: Home / Self Care | Payer: 59 | Source: Ambulatory Visit | Attending: Gynecology | Admitting: Gynecology

## 2016-03-13 DIAGNOSIS — Z1231 Encounter for screening mammogram for malignant neoplasm of breast: Secondary | ICD-10-CM

## 2016-03-27 ENCOUNTER — Encounter: Payer: 59 | Admitting: Gynecology

## 2016-04-03 ENCOUNTER — Ambulatory Visit (INDEPENDENT_AMBULATORY_CARE_PROVIDER_SITE_OTHER): Payer: 59 | Admitting: Gynecology

## 2016-04-03 ENCOUNTER — Encounter: Payer: Self-pay | Admitting: Gynecology

## 2016-04-03 VITALS — BP 120/74 | Ht 62.0 in | Wt 132.0 lb

## 2016-04-03 DIAGNOSIS — Z23 Encounter for immunization: Secondary | ICD-10-CM

## 2016-04-03 DIAGNOSIS — N952 Postmenopausal atrophic vaginitis: Secondary | ICD-10-CM | POA: Diagnosis not present

## 2016-04-03 DIAGNOSIS — Z01419 Encounter for gynecological examination (general) (routine) without abnormal findings: Secondary | ICD-10-CM | POA: Diagnosis not present

## 2016-04-03 DIAGNOSIS — N941 Unspecified dyspareunia: Secondary | ICD-10-CM

## 2016-04-03 NOTE — Addendum Note (Signed)
Addended by: Nelva Nay on: 04/03/2016 12:57 PM   Modules accepted: Orders

## 2016-04-03 NOTE — Progress Notes (Signed)
    Krisztina Edleman May 21, 1956 [id number]        60 y.o.  G2P2  for annual exam.  Several issues noted below.  Past medical history,surgical history, problem list, medications, allergies, family history and social history were all reviewed and documented as reviewed in the EPIC chart.  ROS:  Performed with pertinent positives and negatives included in the history, assessment and plan.   Additional significant findings :  None   Exam: Caryn Bee assistant Vitals:   04/03/16 1000  BP: 120/74  Weight: 132 lb (59.9 kg)  Height: 5\' 2"  (1.575 m)   Body mass index is 24.14 kg/m.  General appearance:  Normal affect, orientation and appearance. Skin: Grossly normal HEENT: Without gross lesions.  No cervical or supraclavicular adenopathy. Thyroid normal.  Lungs:  Clear without wheezing, rales or rhonchi Cardiac: RR, without RMG Abdominal:  Soft, nontender, without masses, guarding, rebound, organomegaly or hernia Breasts:  Examined lying and sitting without masses, retractions, discharge or axillary adenopathy. Pelvic:  Ext, BUS, Vagina with atrophic changes  Cervix with atrophic changes  Uterus anteverted, normal size, shape and contour, midline and mobile nontender   Adnexa without masses or tenderness    Anus and perineum normal   Rectovaginal normal sphincter tone without palpated masses or tenderness.    Assessment/Plan:  60 y.o. G2P2 female for annual exam.   1. Postmenopausal/atrophic genital changes/dyspareunia. Patient not having significant hot flushes or night sweats. Is having increasing issues with vaginal dryness and discomfort with intercourse. Has used OTC lubricants but symptoms seem to be getting worse. I reviewed options with her to include Osphena and vaginal estrogen products such as cream, tablets or rings. Which involved with these products was discussed as well as risk of absorption with systemic effects such as breast, thrombosis and uterine stimulation. Patient  wants to think of her options. Will call if she wants to pursue the vaginal estrogen. 2. Pap smear/HPV 2015 negative. No Pap smear today. History of LEEP 2001 for nabothian cysts but no abnormal cytology history. Plan repeat Pap smear at 5 year interval per current screening guidelines. 3. Mammography 02/2016. Continue with annual mammography when due. SBE monthly reviewed. 4. Colonoscopy 2016. Repeat at their recommended interval. 5. DEXA never. Will plan further into the menopause. Increased calcium vitamin D. 6. Health maintenance. No routine lab work done as patient reports this done elsewhere. Follow up 1 year, sooner as needed.  10 minutes of my time in excess of her breast and pelvic exam was spent in direct face to face counseling and coordination of care in regards to her atrophic vaginitis, dyspareunia and treatment options.    Anastasio Auerbach MD, 10:19 AM 04/03/2016

## 2016-04-03 NOTE — Patient Instructions (Signed)
Call the office if you're interested in trying the vaginal estrogen cream.        You may obtain a copy of any labs that were done today by logging onto MyChart as outlined in the instructions provided with your AVS (after visit summary). The office will not call with normal lab results but certainly if there are any significant abnormalities then we will contact you.   Health Maintenance Adopting a healthy lifestyle and getting preventive care can go a long way to promote health and wellness. Talk with your health care provider about what schedule of regular examinations is right for you. This is a good chance for you to check in with your provider about disease prevention and staying healthy. In between checkups, there are plenty of things you can do on your own. Experts have done a lot of research about which lifestyle changes and preventive measures are most likely to keep you healthy. Ask your health care provider for more information. WEIGHT AND DIET  Eat a healthy diet  Be sure to include plenty of vegetables, fruits, low-fat dairy products, and lean protein.  Do not eat a lot of foods high in solid fats, added sugars, or salt.  Get regular exercise. This is one of the most important things you can do for your health.  Most adults should exercise for at least 150 minutes each week. The exercise should increase your heart rate and make you sweat (moderate-intensity exercise).  Most adults should also do strengthening exercises at least twice a week. This is in addition to the moderate-intensity exercise.  Maintain a healthy weight  Body mass index (BMI) is a measurement that can be used to identify possible weight problems. It estimates body fat based on height and weight. Your health care provider can help determine your BMI and help you achieve or maintain a healthy weight.  For females 1 years of age and older:   A BMI below 18.5 is considered underweight.  A BMI of 18.5 to  24.9 is normal.  A BMI of 25 to 29.9 is considered overweight.  A BMI of 30 and above is considered obese.  Watch levels of cholesterol and blood lipids  You should start having your blood tested for lipids and cholesterol at 59 years of age, then have this test every 5 years.  You may need to have your cholesterol levels checked more often if:  Your lipid or cholesterol levels are high.  You are older than 59 years of age.  You are at high risk for heart disease.  CANCER SCREENING   Lung Cancer  Lung cancer screening is recommended for adults 26-49 years old who are at high risk for lung cancer because of a history of smoking.  A yearly low-dose CT scan of the lungs is recommended for people who:  Currently smoke.  Have quit within the past 15 years.  Have at least a 30-pack-year history of smoking. A pack year is smoking an average of one pack of cigarettes a day for 1 year.  Yearly screening should continue until it has been 15 years since you quit.  Yearly screening should stop if you develop a health problem that would prevent you from having lung cancer treatment.  Breast Cancer  Practice breast self-awareness. This means understanding how your breasts normally appear and feel.  It also means doing regular breast self-exams. Let your health care provider know about any changes, no matter how small.  If you are in  or 30s, you should have a clinical breast exam (CBE) by a health care provider every 1-3 years as part of a regular health exam.  If you are 40 or older, have a CBE every year. Also consider having a breast X-ray (mammogram) every year.  If you have a family history of breast cancer, talk to your health care provider about genetic screening.  If you are at high risk for breast cancer, talk to your health care provider about having an MRI and a mammogram every year.  Breast cancer gene (BRCA) assessment is recommended for women who have family members  with BRCA-related cancers. BRCA-related cancers include:  Breast.  Ovarian.  Tubal.  Peritoneal cancers.  Results of the assessment will determine the need for genetic counseling and BRCA1 and BRCA2 testing. Cervical Cancer Routine pelvic examinations to screen for cervical cancer are no longer recommended for nonpregnant women who are considered low risk for cancer of the pelvic organs (ovaries, uterus, and vagina) and who do not have symptoms. A pelvic examination may be necessary if you have symptoms including those associated with pelvic infections. Ask your health care provider if a screening pelvic exam is right for you.   The Pap test is the screening test for cervical cancer for women who are considered at risk.  If you had a hysterectomy for a problem that was not cancer or a condition that could lead to cancer, then you no longer need Pap tests.  If you are older than 65 years, and you have had normal Pap tests for the past 10 years, you no longer need to have Pap tests.  If you have had past treatment for cervical cancer or a condition that could lead to cancer, you need Pap tests and screening for cancer for at least 20 years after your treatment.  If you no longer get a Pap test, assess your risk factors if they change (such as having a new sexual partner). This can affect whether you should start being screened again.  Some women have medical problems that increase their chance of getting cervical cancer. If this is the case for you, your health care provider may recommend more frequent screening and Pap tests.  The human papillomavirus (HPV) test is another test that may be used for cervical cancer screening. The HPV test looks for the virus that can cause cell changes in the cervix. The cells collected during the Pap test can be tested for HPV.  The HPV test can be used to screen women 30 years of age and older. Getting tested for HPV can extend the interval between normal  Pap tests from three to five years.  An HPV test also should be used to screen women of any age who have unclear Pap test results.  After 59 years of age, women should have HPV testing as often as Pap tests.  Colorectal Cancer  This type of cancer can be detected and often prevented.  Routine colorectal cancer screening usually begins at 59 years of age and continues through 59 years of age.  Your health care provider may recommend screening at an earlier age if you have risk factors for colon cancer.  Your health care provider may also recommend using home test kits to check for hidden blood in the stool.  A small camera at the end of a tube can be used to examine your colon directly (sigmoidoscopy or colonoscopy). This is done to check for the earliest forms of colorectal cancer.    Routine screening usually begins at age 50.  Direct examination of the colon should be repeated every 5-10 years through 59 years of age. However, you may need to be screened more often if early forms of precancerous polyps or small growths are found. Skin Cancer  Check your skin from head to toe regularly.  Tell your health care provider about any new moles or changes in moles, especially if there is a change in a mole's shape or color.  Also tell your health care provider if you have a mole that is larger than the size of a pencil eraser.  Always use sunscreen. Apply sunscreen liberally and repeatedly throughout the day.  Protect yourself by wearing long sleeves, pants, a wide-brimmed hat, and sunglasses whenever you are outside. HEART DISEASE, DIABETES, AND HIGH BLOOD PRESSURE   Have your blood pressure checked at least every 1-2 years. High blood pressure causes heart disease and increases the risk of stroke.  If you are between 55 years and 79 years old, ask your health care provider if you should take aspirin to prevent strokes.  Have regular diabetes screenings. This involves taking a blood  sample to check your fasting blood sugar level.  If you are at a normal weight and have a low risk for diabetes, have this test once every three years after 59 years of age.  If you are overweight and have a high risk for diabetes, consider being tested at a younger age or more often. PREVENTING INFECTION  Hepatitis B  If you have a higher risk for hepatitis B, you should be screened for this virus. You are considered at high risk for hepatitis B if:  You were born in a country where hepatitis B is common. Ask your health care provider which countries are considered high risk.  Your parents were born in a high-risk country, and you have not been immunized against hepatitis B (hepatitis B vaccine).  You have HIV or AIDS.  You use needles to inject street drugs.  You live with someone who has hepatitis B.  You have had sex with someone who has hepatitis B.  You get hemodialysis treatment.  You take certain medicines for conditions, including cancer, organ transplantation, and autoimmune conditions. Hepatitis C  Blood testing is recommended for:  Everyone born from 1945 through 1965.  Anyone with known risk factors for hepatitis C. Sexually transmitted infections (STIs)  You should be screened for sexually transmitted infections (STIs) including gonorrhea and chlamydia if:  You are sexually active and are younger than 59 years of age.  You are older than 59 years of age and your health care provider tells you that you are at risk for this type of infection.  Your sexual activity has changed since you were last screened and you are at an increased risk for chlamydia or gonorrhea. Ask your health care provider if you are at risk.  If you do not have HIV, but are at risk, it may be recommended that you take a prescription medicine daily to prevent HIV infection. This is called pre-exposure prophylaxis (PrEP). You are considered at risk if:  You are sexually active and do not  regularly use condoms or know the HIV status of your partner(s).  You take drugs by injection.  You are sexually active with a partner who has HIV. Talk with your health care provider about whether you are at high risk of being infected with HIV. If you choose to begin PrEP, you should first   be tested for HIV. You should then be tested every 3 months for as long as you are taking PrEP.  PREGNANCY   If you are premenopausal and you may become pregnant, ask your health care provider about preconception counseling.  If you may become pregnant, take 400 to 800 micrograms (mcg) of folic acid every day.  If you want to prevent pregnancy, talk to your health care provider about birth control (contraception). OSTEOPOROSIS AND MENOPAUSE   Osteoporosis is a disease in which the bones lose minerals and strength with aging. This can result in serious bone fractures. Your risk for osteoporosis can be identified using a bone density scan.  If you are 65 years of age or older, or if you are at risk for osteoporosis and fractures, ask your health care provider if you should be screened.  Ask your health care provider whether you should take a calcium or vitamin D supplement to lower your risk for osteoporosis.  Menopause may have certain physical symptoms and risks.  Hormone replacement therapy may reduce some of these symptoms and risks. Talk to your health care provider about whether hormone replacement therapy is right for you.  HOME CARE INSTRUCTIONS   Schedule regular health, dental, and eye exams.  Stay current with your immunizations.   Do not use any tobacco products including cigarettes, chewing tobacco, or electronic cigarettes.  If you are pregnant, do not drink alcohol.  If you are breastfeeding, limit how much and how often you drink alcohol.  Limit alcohol intake to no more than 1 drink per day for nonpregnant women. One drink equals 12 ounces of beer, 5 ounces of wine, or 1  ounces of hard liquor.  Do not use street drugs.  Do not share needles.  Ask your health care provider for help if you need support or information about quitting drugs.  Tell your health care provider if you often feel depressed.  Tell your health care provider if you have ever been abused or do not feel safe at home. Document Released: 12/16/2010 Document Revised: 10/17/2013 Document Reviewed: 05/04/2013 Exit[employer] Patient Information 2015 [employer]. This information is not intended to replace advice given to you by your health care provider. Make sure you discuss any questions you have with your health care provider.  

## 2016-05-20 ENCOUNTER — Ambulatory Visit
Admission: RE | Admit: 2016-05-20 | Discharge: 2016-05-20 | Disposition: A | Discharge status: Home / Self Care | Payer: 59 | Source: Ambulatory Visit | Attending: Family Medicine | Admitting: Family Medicine

## 2016-05-20 ENCOUNTER — Other Ambulatory Visit: Payer: Self-pay | Admitting: Family Medicine

## 2016-05-20 DIAGNOSIS — S6990XA Unspecified injury of unspecified wrist, hand and finger(s), initial encounter: Secondary | ICD-10-CM

## 2016-09-29 DIAGNOSIS — H1045 Other chronic allergic conjunctivitis: Secondary | ICD-10-CM | POA: Diagnosis not present

## 2016-11-07 DIAGNOSIS — E7800 Pure hypercholesterolemia, unspecified: Secondary | ICD-10-CM | POA: Diagnosis not present

## 2016-11-07 DIAGNOSIS — I10 Essential (primary) hypertension: Secondary | ICD-10-CM | POA: Diagnosis not present

## 2016-11-07 DIAGNOSIS — G4700 Insomnia, unspecified: Secondary | ICD-10-CM | POA: Diagnosis not present

## 2017-02-17 ENCOUNTER — Other Ambulatory Visit: Payer: Self-pay | Admitting: Gynecology

## 2017-02-17 DIAGNOSIS — Z1231 Encounter for screening mammogram for malignant neoplasm of breast: Secondary | ICD-10-CM

## 2017-03-17 ENCOUNTER — Ambulatory Visit
Admission: RE | Admit: 2017-03-17 | Discharge: 2017-03-17 | Disposition: A | Discharge status: Home / Self Care | Payer: 59 | Source: Ambulatory Visit | Attending: Gynecology | Admitting: Gynecology

## 2017-03-17 DIAGNOSIS — Z1231 Encounter for screening mammogram for malignant neoplasm of breast: Secondary | ICD-10-CM

## 2017-04-06 ENCOUNTER — Encounter: Payer: Self-pay | Admitting: Gynecology

## 2017-04-06 ENCOUNTER — Ambulatory Visit (INDEPENDENT_AMBULATORY_CARE_PROVIDER_SITE_OTHER): Payer: 59 | Admitting: Gynecology

## 2017-04-06 VITALS — BP 120/76 | Ht 62.0 in | Wt 135.0 lb

## 2017-04-06 DIAGNOSIS — Z01411 Encounter for gynecological examination (general) (routine) with abnormal findings: Secondary | ICD-10-CM | POA: Diagnosis not present

## 2017-04-06 DIAGNOSIS — N941 Unspecified dyspareunia: Secondary | ICD-10-CM

## 2017-04-06 DIAGNOSIS — N952 Postmenopausal atrophic vaginitis: Secondary | ICD-10-CM | POA: Diagnosis not present

## 2017-04-06 MED ORDER — ESTRADIOL 10 MCG VA TABS: 1.0000 | ORAL_TABLET | Freq: Every day | VAGINAL | 8 refills | Status: DC

## 2017-04-06 NOTE — Progress Notes (Signed)
    Judy Arellano 06-15-1956 [id number]        61 y.o.  G2P2 for annual gynecologic exam.  Also complaining about vaginal dryness and pain with intercourse. Not having issues daily but primarily with intercourse. Has tried OTC lubricants and moisturizers without benefit.  Past medical history,surgical history, problem list, medications, allergies, family history and social history were all reviewed and documented as reviewed in the EPIC chart.  ROS:  Performed with pertinent positives and negatives included in the history, assessment and plan.   Additional significant findings :  None   Exam: Caryn Bee assistant Vitals:   04/06/17 1531  BP: 120/76  Weight: 135 lb (61.2 kg)  Height: 5\' 2"  (1.575 m)   Body mass index is 24.69 kg/m.  General appearance:  Normal affect, orientation and appearance. Skin: Grossly normal HEENT: Without gross lesions.  No cervical or supraclavicular adenopathy. Thyroid normal.  Lungs:  Clear without wheezing, rales or rhonchi Cardiac: RR, without RMG Abdominal:  Soft, nontender, without masses, guarding, rebound, organomegaly or hernia Breasts:  Examined lying and sitting without masses, retractions, discharge or axillary adenopathy. Pelvic:  Ext, BUS, Vagina: with atrophic changes  Cervix: with atrophic changes  Uterus: anteverted, normal size, shape and contour, midline and mobile nontender   Adnexa: Without masses or tenderness    Anus and perineum: Normal   Rectovaginal: Normal sphincter tone without palpated masses or tenderness.    Assessment/Plan:  61 y.o. G2P2 female for annual gynecologic exam.   1. Postmenopausal/atrophic genital changes/dyspareunia. Has tried OTC products without success. I reviewed options to include vaginal estrogen up to including full HRT. Having some hot flushes but these seem to be getting better. Benefits of vaginal alone treatment discussed to limit systemic exposure. Possibility for absorption with systemic  effects to include thrombosis, breast and endometrial stimulation was reviewed. Various delivery routes to include oral such as Osphena, cream such as Premarin, Estrace or formulated, vaginal tablets such as Vagifem and Imvexxy were all reviewed. At this point patient wants to try Vagifem nightly 2 weeks and then twice weekly. She will report to me if she continues to have issues after 1-2 months of usage. She'll call me sooner if she has any issues. She knows to call she does any vaginal bleeding. 2. Pap smear/HPV 2015. No Pap smear done today. History of LEEP 2001 for nabothian cysts but no abnormal cytology. Plan repeat Pap smear at 5 year interval per current screening guidelines. 3. Mammography 03/2017. Continue with annual mammography next year. Breast exam normal today. 4. DEXA never. Will plan further into the menopause. Increased calcium vitamin D reviewed. 5. Colonoscopy 2009.  Recommend follow up with gastroenterologist to check when she is due. 6. Health maintenance. No routine lab work done as this is done elsewhere. Follow up 1 year, sooner as needed.  Additional time in excess of her routine gynecologic exam was spent in direct face to face counseling and coordination of care in regards to her atrophic vaginitis with dyspareunia.    Anastasio Auerbach MD, 4:49 PM 04/06/2017

## 2017-04-06 NOTE — Patient Instructions (Addendum)
Start on the vaginal estrogen tablets nightly for 2 weeks then twice weekly. Call if symptoms persist after 1-2 months.

## 2017-05-14 DIAGNOSIS — D2372 Other benign neoplasm of skin of left lower limb, including hip: Secondary | ICD-10-CM | POA: Diagnosis not present

## 2017-05-14 DIAGNOSIS — L820 Inflamed seborrheic keratosis: Secondary | ICD-10-CM | POA: Diagnosis not present

## 2017-05-14 DIAGNOSIS — L723 Sebaceous cyst: Secondary | ICD-10-CM | POA: Diagnosis not present

## 2017-05-14 DIAGNOSIS — L821 Other seborrheic keratosis: Secondary | ICD-10-CM | POA: Diagnosis not present

## 2017-07-24 DIAGNOSIS — M79672 Pain in left foot: Secondary | ICD-10-CM | POA: Diagnosis not present

## 2017-07-24 DIAGNOSIS — G5762 Lesion of plantar nerve, left lower limb: Secondary | ICD-10-CM | POA: Diagnosis not present

## 2017-08-12 DIAGNOSIS — N3000 Acute cystitis without hematuria: Secondary | ICD-10-CM | POA: Diagnosis not present

## 2017-09-04 DIAGNOSIS — M79672 Pain in left foot: Secondary | ICD-10-CM | POA: Diagnosis not present

## 2017-09-04 DIAGNOSIS — M7742 Metatarsalgia, left foot: Secondary | ICD-10-CM | POA: Diagnosis not present

## 2017-09-04 DIAGNOSIS — M7741 Metatarsalgia, right foot: Secondary | ICD-10-CM | POA: Diagnosis not present

## 2017-10-06 DIAGNOSIS — H1045 Other chronic allergic conjunctivitis: Secondary | ICD-10-CM | POA: Diagnosis not present

## 2017-11-06 DIAGNOSIS — Z1159 Encounter for screening for other viral diseases: Secondary | ICD-10-CM | POA: Diagnosis not present

## 2017-11-06 DIAGNOSIS — E7800 Pure hypercholesterolemia, unspecified: Secondary | ICD-10-CM | POA: Diagnosis not present

## 2017-11-06 DIAGNOSIS — I10 Essential (primary) hypertension: Secondary | ICD-10-CM | POA: Diagnosis not present

## 2017-11-12 DIAGNOSIS — Z0000 Encounter for general adult medical examination without abnormal findings: Secondary | ICD-10-CM | POA: Diagnosis not present

## 2017-11-12 DIAGNOSIS — E7800 Pure hypercholesterolemia, unspecified: Secondary | ICD-10-CM | POA: Diagnosis not present

## 2017-11-12 DIAGNOSIS — H6123 Impacted cerumen, bilateral: Secondary | ICD-10-CM | POA: Diagnosis not present

## 2017-11-12 DIAGNOSIS — J309 Allergic rhinitis, unspecified: Secondary | ICD-10-CM | POA: Diagnosis not present

## 2017-11-12 DIAGNOSIS — I10 Essential (primary) hypertension: Secondary | ICD-10-CM | POA: Diagnosis not present

## 2017-12-24 DIAGNOSIS — H9313 Tinnitus, bilateral: Secondary | ICD-10-CM | POA: Diagnosis not present

## 2018-02-09 ENCOUNTER — Other Ambulatory Visit: Payer: Self-pay | Admitting: Gynecology

## 2018-02-09 DIAGNOSIS — Z1231 Encounter for screening mammogram for malignant neoplasm of breast: Secondary | ICD-10-CM

## 2018-03-23 ENCOUNTER — Ambulatory Visit
Admission: RE | Admit: 2018-03-23 | Discharge: 2018-03-23 | Disposition: A | Discharge status: Home / Self Care | Payer: 59 | Source: Ambulatory Visit | Attending: Gynecology | Admitting: Gynecology

## 2018-03-23 DIAGNOSIS — Z1231 Encounter for screening mammogram for malignant neoplasm of breast: Secondary | ICD-10-CM | POA: Diagnosis not present

## 2018-04-08 ENCOUNTER — Encounter: Payer: Self-pay | Admitting: Gynecology

## 2018-04-08 ENCOUNTER — Ambulatory Visit: Payer: 59 | Admitting: Gynecology

## 2018-04-08 VITALS — BP 118/78 | Ht 62.0 in | Wt 137.0 lb

## 2018-04-08 DIAGNOSIS — Z23 Encounter for immunization: Secondary | ICD-10-CM

## 2018-04-08 DIAGNOSIS — Z1151 Encounter for screening for human papillomavirus (HPV): Secondary | ICD-10-CM

## 2018-04-08 DIAGNOSIS — N952 Postmenopausal atrophic vaginitis: Secondary | ICD-10-CM

## 2018-04-08 DIAGNOSIS — Z01419 Encounter for gynecological examination (general) (routine) without abnormal findings: Secondary | ICD-10-CM

## 2018-04-08 MED ORDER — ESTRADIOL 4 MCG VA INST: 1.0000 | VAGINAL_INSERT | VAGINAL | 12 refills | Status: DC

## 2018-04-08 NOTE — Addendum Note (Signed)
Addended by: Nelva Nay on: 04/08/2018 10:42 AM   Modules accepted: Orders

## 2018-04-08 NOTE — Patient Instructions (Signed)
Follow-up for bone density as scheduled.  Call me in follow-up to how you are doing on the Imvexxy

## 2018-04-08 NOTE — Progress Notes (Signed)
    Judy Arellano [birth year] [id number]        62 y.o.  G2P2 for annual gynecologic exam.  Charted Vagifem last year due to vaginal dryness and dyspareunia.  Did well as far as relief of her symptoms but felt bloated and had a vaginal odor that she did not like.  She stopped the Vagifem and unfortunately has resumed her discomfort.  Past medical history,surgical history, problem list, medications, allergies, family history and social history were all reviewed and documented as reviewed in the EPIC chart.  ROS:  Performed with pertinent positives and negatives included in the history, assessment and plan.   Additional significant findings : None   Exam: Caryn Bee assistant Vitals:   04/08/18 0953  BP: 118/78  Weight: 137 lb (62.1 kg)  Height: 5\' 2"  (1.575 m)   Body mass index is 25.06 kg/m.  General appearance:  Normal affect, orientation and appearance. Skin: Grossly normal HEENT: Without gross lesions.  No cervical or supraclavicular adenopathy. Thyroid normal.  Lungs:  Clear without wheezing, rales or rhonchi Cardiac: RR, without RMG Abdominal:  Soft, nontender, without masses, guarding, rebound, organomegaly or hernia Breasts:  Examined lying and sitting without masses, retractions, discharge or axillary adenopathy. Pelvic:  Ext, BUS, Vagina: Normal with atrophic changes  Cervix: Normal with atrophic changes.  Pap smear/HPV done.  Uterus: Anteverted, normal size, shape and contour, midline and mobile nontender   Adnexa: Without masses or tenderness    Anus and perineum: Normal   Rectovaginal: Normal sphincter tone without palpated masses or tenderness.    Assessment/Plan:  62 y.o. G2P2 female for annual gynecologic exam.   1. Postmenopausal/atrophic genital changes/dyspareunia.  We again reviewed options to include HRT, OTC products, vaginal estrogen and vaginal laser.  Vaginal estrogen products reviewed to include cream, Imvexxy, Vagifem, Osphena.  Patient would like  trial of Imvexxy.  4 mcg #8 twice weekly with 1 year refill provided.  Will avoid the 14-day start up as her vaginal tissue seemed sensitive to estrogen in that she was using it only once weekly with the Vagifem and had good results.  She will start the Imvexxy twice weekly and will see how she does with this.  I asked her to call me in a month or so to let me know how she is doing. 2. Pap smear/HPV 2015.  Pap smear/HPV today.  History of LEEP 2001 for nabothian cysts but no abnormal cytology. 3. Mammography 03/2018.  Continue with annual mammography next year.  Breast exam normal today. 4. DEXA never.  Recommend baseline DEXA now at age 83 she will schedule in follow-up for this. 5. Colonoscopy 2013.  Repeat at their recommended interval. 6. Health maintenance.  No routine lab work done as patient does this elsewhere.  Follow-up 1 year, sooner as needed.   Anastasio Auerbach MD, 10:32 AM 04/08/2018

## 2018-04-12 LAB — PAP IG AND HPV HIGH-RISK: HPV DNA HIGH RISK: NOT DETECTED

## 2018-04-16 DIAGNOSIS — M8580 Other specified disorders of bone density and structure, unspecified site: Secondary | ICD-10-CM

## 2018-04-16 HISTORY — DX: Other specified disorders of bone density and structure, unspecified site: M85.80

## 2018-04-19 ENCOUNTER — Ambulatory Visit (INDEPENDENT_AMBULATORY_CARE_PROVIDER_SITE_OTHER): Payer: 59

## 2018-04-19 ENCOUNTER — Other Ambulatory Visit: Payer: Self-pay | Admitting: Gynecology

## 2018-04-19 DIAGNOSIS — Z13820 Encounter for screening for osteoporosis: Secondary | ICD-10-CM

## 2018-04-19 DIAGNOSIS — Z01419 Encounter for gynecological examination (general) (routine) without abnormal findings: Secondary | ICD-10-CM

## 2018-04-19 DIAGNOSIS — M8589 Other specified disorders of bone density and structure, multiple sites: Secondary | ICD-10-CM

## 2018-04-20 ENCOUNTER — Encounter: Payer: Self-pay | Admitting: Gynecology

## 2018-05-28 DIAGNOSIS — D225 Melanocytic nevi of trunk: Secondary | ICD-10-CM | POA: Diagnosis not present

## 2018-05-28 DIAGNOSIS — D2372 Other benign neoplasm of skin of left lower limb, including hip: Secondary | ICD-10-CM | POA: Diagnosis not present

## 2018-05-28 DIAGNOSIS — Z85828 Personal history of other malignant neoplasm of skin: Secondary | ICD-10-CM | POA: Diagnosis not present

## 2019-02-14 ENCOUNTER — Other Ambulatory Visit: Payer: Self-pay | Admitting: Gynecology

## 2019-02-14 DIAGNOSIS — Z1231 Encounter for screening mammogram for malignant neoplasm of breast: Secondary | ICD-10-CM

## 2019-03-15 ENCOUNTER — Encounter: Payer: Self-pay | Admitting: Gynecology

## 2019-03-30 ENCOUNTER — Other Ambulatory Visit: Payer: Self-pay

## 2019-03-30 ENCOUNTER — Ambulatory Visit
Admission: RE | Admit: 2019-03-30 | Discharge: 2019-03-30 | Disposition: A | Discharge status: Home / Self Care | Payer: 59 | Source: Ambulatory Visit | Attending: Gynecology | Admitting: Gynecology

## 2019-03-30 DIAGNOSIS — Z1231 Encounter for screening mammogram for malignant neoplasm of breast: Secondary | ICD-10-CM

## 2019-04-12 ENCOUNTER — Other Ambulatory Visit: Payer: Self-pay

## 2019-04-13 ENCOUNTER — Encounter: Payer: Self-pay | Admitting: Gynecology

## 2019-04-13 ENCOUNTER — Ambulatory Visit (INDEPENDENT_AMBULATORY_CARE_PROVIDER_SITE_OTHER): Payer: 59 | Admitting: Gynecology

## 2019-04-13 VITALS — BP 124/78 | Ht 61.5 in | Wt 137.0 lb

## 2019-04-13 DIAGNOSIS — M8580 Other specified disorders of bone density and structure, unspecified site: Secondary | ICD-10-CM

## 2019-04-13 DIAGNOSIS — Z23 Encounter for immunization: Secondary | ICD-10-CM

## 2019-04-13 DIAGNOSIS — N952 Postmenopausal atrophic vaginitis: Secondary | ICD-10-CM | POA: Diagnosis not present

## 2019-04-13 DIAGNOSIS — Z01419 Encounter for gynecological examination (general) (routine) without abnormal findings: Secondary | ICD-10-CM

## 2019-04-13 NOTE — Patient Instructions (Signed)
Call if you decide you want to try the vaginal estradiol cream from [place]  Follow-up in 1 year for annual exam

## 2019-04-13 NOTE — Progress Notes (Signed)
    Judy Arellano 12-12-1955 [id number]        63 y.o.  G2P2 for annual gynecologic exam.  Continues to have issues with vaginal dryness.  Had tried Vagifem and Imvexxy but did not like the vaginal drainage associated with it.  Past medical history,surgical history, problem list, medications, allergies, family history and social history were all reviewed and documented as reviewed in the EPIC chart.  ROS:  Performed with pertinent positives and negatives included in the history, assessment and plan.   Additional significant findings : None   Exam: Caryn Bee assistant Vitals:   04/13/19 1059  BP: 124/78  Weight: 137 lb (62.1 kg)  Height: 5' 1.5" (1.562 m)   Body mass index is 25.47 kg/m.  General appearance:  Normal affect, orientation and appearance. Skin: Grossly normal HEENT: Without gross lesions.  No cervical or supraclavicular adenopathy. Thyroid normal.  Lungs:  Clear without wheezing, rales or rhonchi Cardiac: RR, without RMG Abdominal:  Soft, nontender, without masses, guarding, rebound, organomegaly or hernia Breasts:  Examined lying and sitting without masses, retractions, discharge or axillary adenopathy. Pelvic:  Ext, BUS, Vagina: Normal with atrophic changes  Cervix: Normal with atrophic changes  Uterus: Anteverted, normal size, shape and contour, midline and mobile nontender   Adnexa: Without masses or tenderness    Anus and perineum: Normal   Rectovaginal: Normal sphincter tone without palpated masses or tenderness.    Assessment/Plan:  63 y.o. G2P2 female for annual gynecologic exam.   1. Postmenopausal/atrophic genital changes.  We again reviewed options to include trial of vaginal estradiol cream to [place].  We also discussed Osphena and laser treatment.  At this point she does not want intervention.  She may consider the vaginal estradiol cream and will call if she wants to do this. 2. Osteopenia.  DEXA 2019 T score -2.1 FRAX 10% / 1.5%.   Reviewed report with her.  Does not think that she is ever had vitamin D checked.  We will check vitamin D level today.  Calcium recommendations reviewed.  Plan repeat DEXA next year at 2-year interval. 3. Mammography 03/2019.  Continue with annual mammography next year.  Breast exam normal today. 4. Pap smear/HPV 2019.  No Pap smear done today.  History of LEEP 2001 for nabothian cyst but no abnormal cytology.  Plan repeat Pap smear/HPV at 5-year interval per current screening guidelines 5. Colonoscopy 2016.  Repeat at their recommended interval. 6. Health maintenance.  No routine lab work done as patient does this elsewhere.  Follow-up 1 year, sooner as needed.   Anastasio Auerbach MD, 11:27 AM 04/13/2019

## 2019-04-14 ENCOUNTER — Encounter: Payer: Self-pay | Admitting: Gynecology

## 2019-04-14 LAB — VITAMIN D 25 HYDROXY (VIT D DEFICIENCY, FRACTURES): Vit D, 25-Hydroxy: 33 ng/mL (ref 30–100)

## 2019-04-15 ENCOUNTER — Telehealth: Payer: Self-pay | Admitting: *Deleted

## 2019-04-15 NOTE — Telephone Encounter (Signed)
Vaginal estradiol cream prefilled syringes through [place] twice weekly 54-month supply refill x1 year

## 2019-04-15 NOTE — Telephone Encounter (Signed)
Patient called decided she would like to proceed with  compound vaginal estradiol cream Rx as discussed on 04/13/19. How many refills?

## 2019-04-18 MED ORDER — NONFORMULARY OR COMPOUNDED ITEM: 4 refills | Status: DC

## 2019-04-18 NOTE — Telephone Encounter (Signed)
Rx called in, patient aware.  

## 2019-09-20 DIAGNOSIS — H9313 Tinnitus, bilateral: Secondary | ICD-10-CM | POA: Insufficient documentation

## 2020-03-15 ENCOUNTER — Other Ambulatory Visit: Payer: Self-pay | Admitting: Family Medicine

## 2020-03-15 DIAGNOSIS — Z1231 Encounter for screening mammogram for malignant neoplasm of breast: Secondary | ICD-10-CM

## 2020-03-28 ENCOUNTER — Ambulatory Visit
Admission: RE | Admit: 2020-03-28 | Discharge: 2020-03-28 | Disposition: A | Discharge status: Home / Self Care | Payer: No Typology Code available for payment source | Source: Ambulatory Visit | Attending: Family Medicine | Admitting: Family Medicine

## 2020-03-28 ENCOUNTER — Other Ambulatory Visit: Payer: Self-pay

## 2020-03-28 DIAGNOSIS — Z1231 Encounter for screening mammogram for malignant neoplasm of breast: Secondary | ICD-10-CM

## 2020-05-13 IMAGING — MG DIGITAL SCREENING BILATERAL MAMMOGRAM WITH TOMO AND CAD
8 series · 8 of 24 positions shown · non-contrast
Comparison: Previous exam(s).

CLINICAL DATA: Screening.

EXAM:
DIGITAL SCREENING BILATERAL MAMMOGRAM WITH TOMO AND CAD

[R MLO synth-2D]
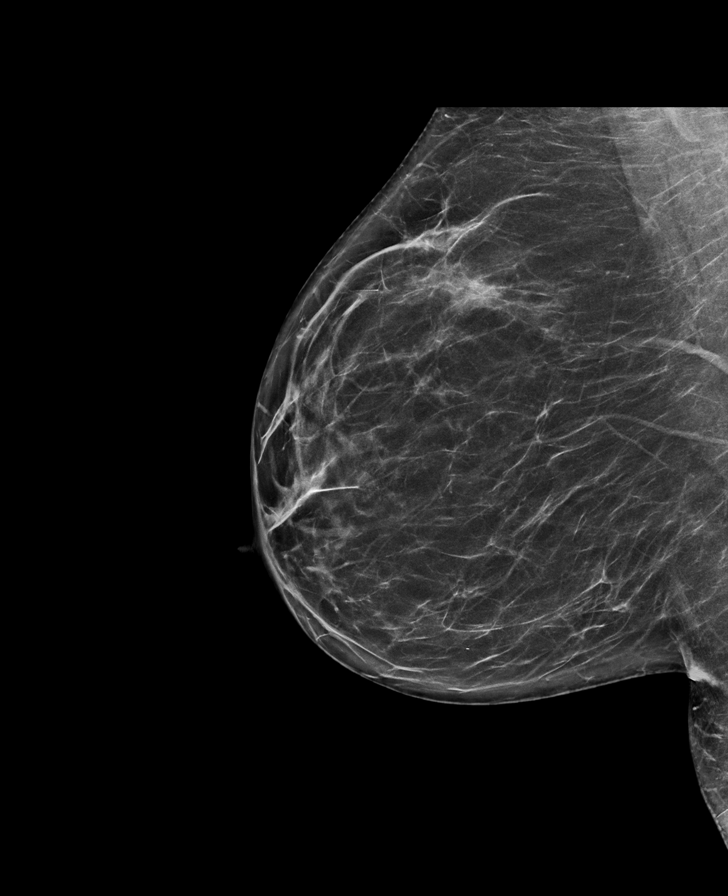

[L CC synth-2D]
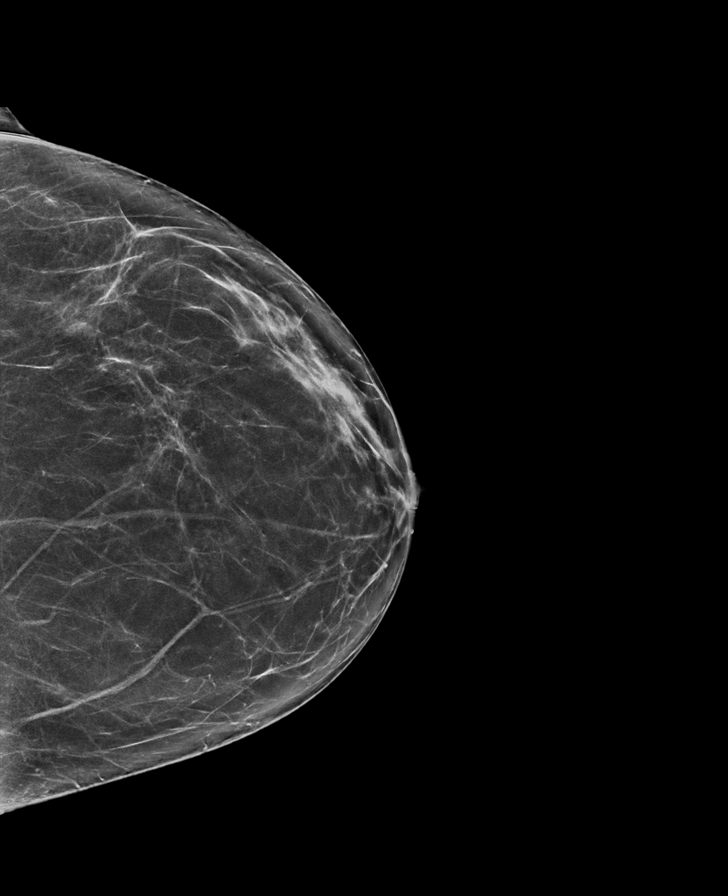

[R CC synth-2D]
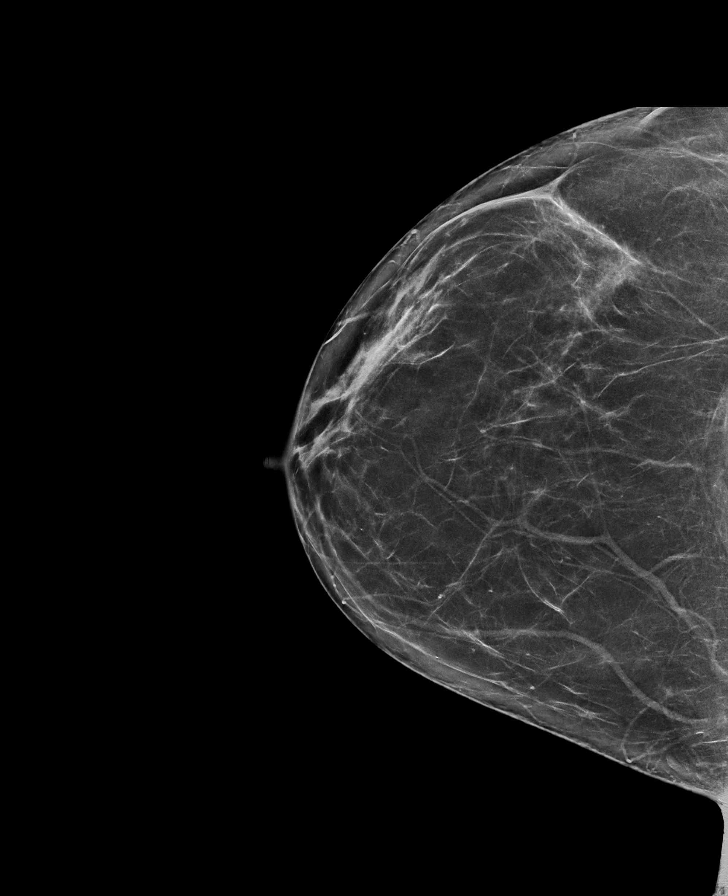

[L MLO synth-2D]
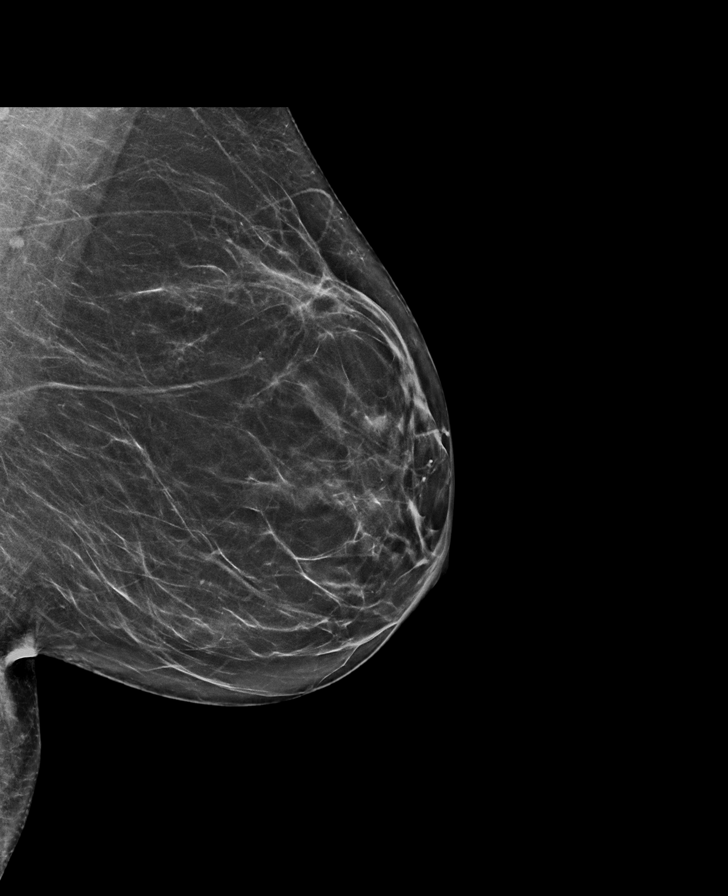

[L CC tomo · tomo slice 35/68.0]
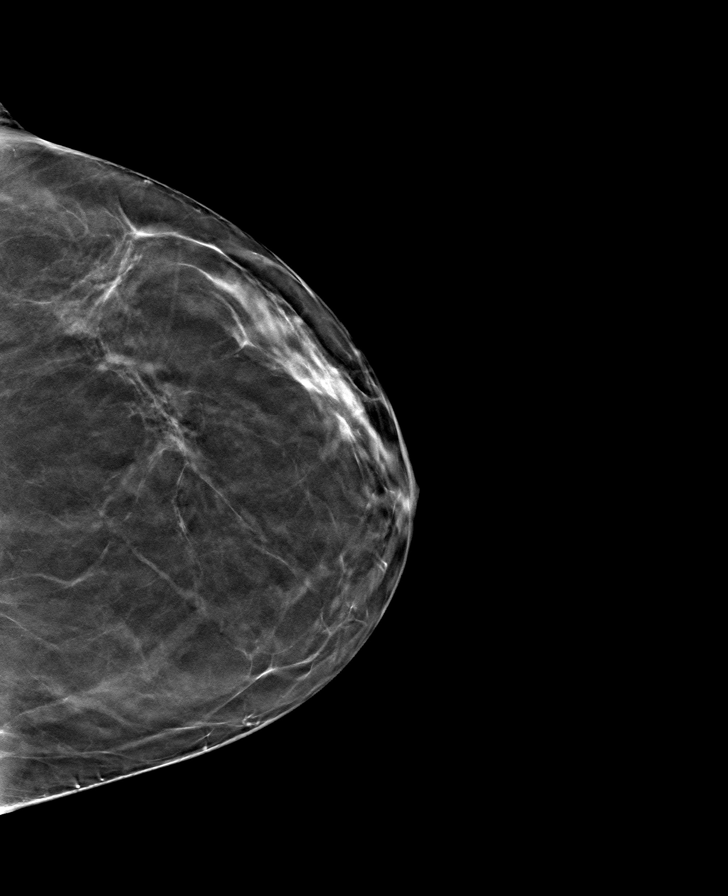

[L MLO tomo · tomo slice 36/71.0]
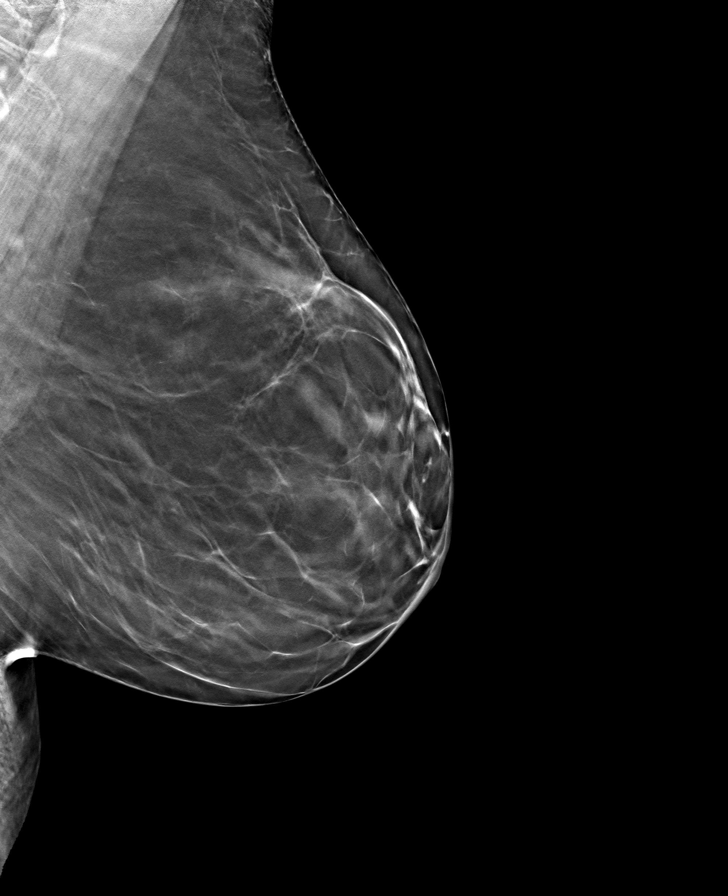

[R CC tomo · tomo slice 37/72.0]
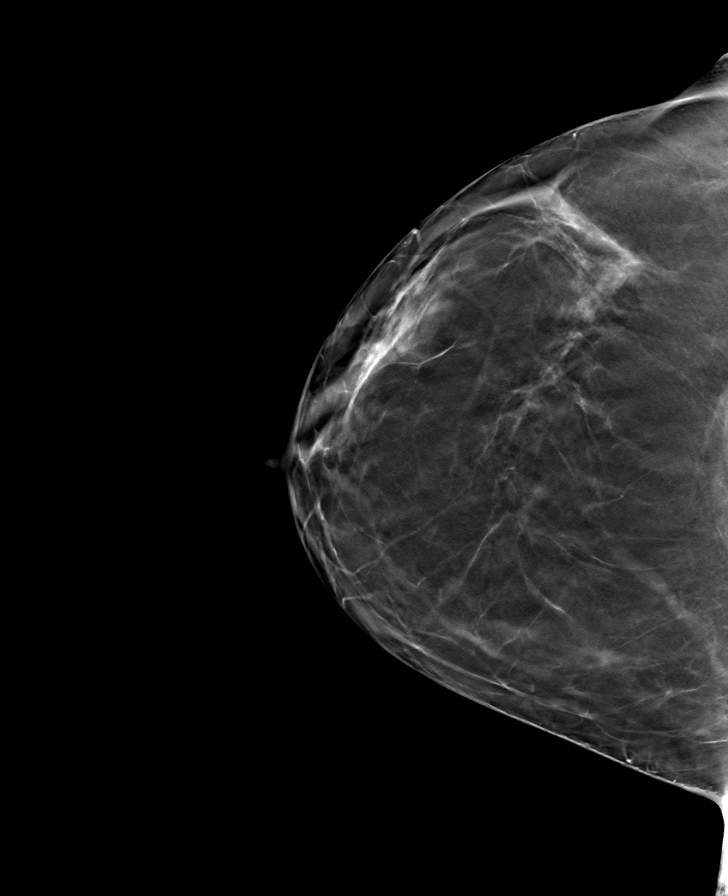

[R MLO tomo · tomo slice 36/71.0]
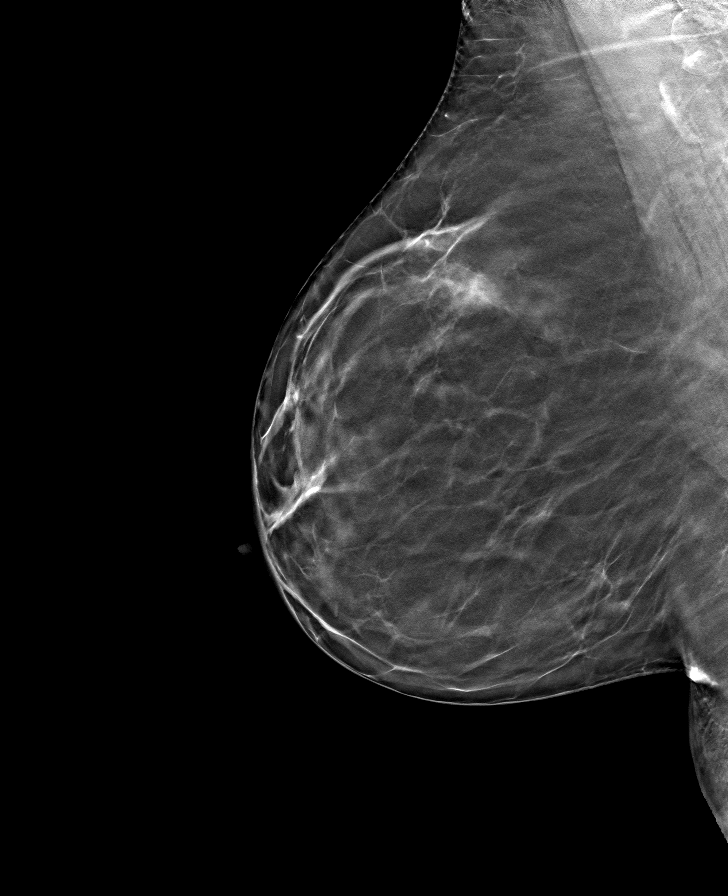

[8 of 24 positions shown; findings below may reference images not displayed]

ACR Breast Density Category b: There are scattered areas of
fibroglandular density.
FINDINGS: There are no findings suspicious for malignancy. Images were
processed with CAD.
IMPRESSION: No mammographic evidence of malignancy. A result letter of this
screening mammogram will be mailed directly to the patient.

RECOMMENDATION:
Screening mammogram in one year. (Code:[id number])

BI-RADS CATEGORY  1: Negative.

## 2020-09-17 ENCOUNTER — Other Ambulatory Visit (HOSPITAL_COMMUNITY)
Admission: RE | Admit: 2020-09-17 | Discharge: 2020-09-17 | Disposition: A | Discharge status: Home / Self Care | Payer: No Typology Code available for payment source | Source: Ambulatory Visit | Attending: Obstetrics & Gynecology | Admitting: Obstetrics & Gynecology

## 2020-09-17 ENCOUNTER — Ambulatory Visit (HOSPITAL_BASED_OUTPATIENT_CLINIC_OR_DEPARTMENT_OTHER): Payer: No Typology Code available for payment source | Admitting: Obstetrics & Gynecology

## 2020-09-17 ENCOUNTER — Encounter (HOSPITAL_BASED_OUTPATIENT_CLINIC_OR_DEPARTMENT_OTHER): Payer: Self-pay | Admitting: Obstetrics & Gynecology

## 2020-09-17 ENCOUNTER — Other Ambulatory Visit: Payer: Self-pay

## 2020-09-17 VITALS — BP 132/71 | HR 82 | Resp 18 | Ht 61.75 in | Wt 138.6 lb

## 2020-09-17 DIAGNOSIS — N952 Postmenopausal atrophic vaginitis: Secondary | ICD-10-CM | POA: Diagnosis not present

## 2020-09-17 DIAGNOSIS — M85851 Other specified disorders of bone density and structure, right thigh: Secondary | ICD-10-CM

## 2020-09-17 DIAGNOSIS — Z01419 Encounter for gynecological examination (general) (routine) without abnormal findings: Secondary | ICD-10-CM

## 2020-09-17 DIAGNOSIS — M85852 Other specified disorders of bone density and structure, left thigh: Secondary | ICD-10-CM

## 2020-09-17 DIAGNOSIS — Z124 Encounter for screening for malignant neoplasm of cervix: Secondary | ICD-10-CM

## 2020-09-17 DIAGNOSIS — I10 Essential (primary) hypertension: Secondary | ICD-10-CM | POA: Insufficient documentation

## 2020-09-17 NOTE — Progress Notes (Addendum)
65 y.o. G2P2 Married White or Caucasian female here for annual exam.  She is a new patient and used to see Dr. Phineas Real.  Denies vaginal bleeding.  Does have vaginal dryness.  Has used vaginal estrogen cream.  Had bloating and some pelvic cramping with this.  Felt like she was going to start a cycle.    Patient's last menstrual period was 08/16/2011.          Sexually active: Yes.     Exercising: Yes.   Smoker:  no  Health Maintenance: Pap:  04/08/2018 History of abnormal Pap:  no MMG:  03/2020 Colonoscopy:  2016?Marland Kitchen  Pt reports follow up 10 years was recommended. BMD:   2019, -2.1. TDaP:  Pt thinks this was done with Dr. Alyson Ingles Pneumonia vaccine(s):  n/a Shingrix:   Has completed Hep C testing: done with [facility] per pt Screening Labs: done with Dr. Alyson Ingles   reports that she has never smoked. She has never used smokeless tobacco. She reports current alcohol use. She reports that she does not use drugs.  Past Medical History:  Diagnosis Date   Allergy    seasonal   Cancer ([facility])    skin on face and back   High cholesterol    Hypertension    Osteopenia 04/2018   T score -2.1 FRAX 10% / 1.5%    Past Surgical History:  Procedure Laterality Date   CERVICAL BIOPSY  W/ LOOP ELECTRODE EXCISION  2001   For nabothian cyst NOT abnormal Pap smear   CESAREAN SECTION     excision of skin cancer  2004 and 2005   face and back   TONSILLECTOMY      Current Outpatient Medications  Medication Sig Dispense Refill   amLODipine (NORVASC) 10 MG tablet Take 1 tablet by mouth daily.     aspirin 81 MG tablet Take 81 mg by mouth daily.     Azelastine HCl 137 MCG/SPRAY SOLN SMARTSIG:1-2 Spray(s) Both Nares Twice Daily PRN     BIOTIN PO Take 1 tablet by mouth daily.     Calcium Carb-Cholecalciferol (CALCIUM 600 + D PO) Take by mouth.     celecoxib (CELEBREX) 200 MG capsule Take 200 mg by mouth daily.     losartan-hydrochlorothiazide (HYZAAR) 100-25 MG per tablet Take 1 tablet by mouth daily.      pravastatin (PRAVACHOL) 40 MG tablet Take 40 mg by mouth daily.     No current facility-administered medications for this visit.    Family History  Problem Relation Age of Onset   Hypertension Father    Heart disease Father    Hyperlipidemia Father    Glaucoma Mother    Dementia Mother    Hyperlipidemia Brother    Colon cancer Neg Hx    Breast cancer Neg Hx     Review of Systems  All other systems reviewed and are negative.   Exam:   BP 132/71   Pulse 82   Resp 18   Ht 5' 1.75" (1.568 m)   Wt 138 lb 9.6 oz (62.9 kg)   LMP 08/16/2011   BMI 25.56 kg/m   Height: 5' 1.75" (156.8 cm)  General appearance: alert, cooperative and appears stated age Head: Normocephalic, without obvious abnormality, atraumatic Neck: no adenopathy, supple, symmetrical, trachea midline and thyroid normal to inspection and palpation Lungs: clear to auscultation bilaterally Breasts: normal appearance, no masses or tenderness Heart: regular rate and rhythm Abdomen: soft, non-tender; bowel sounds normal; no masses,  no organomegaly Extremities: extremities  normal, atraumatic, no cyanosis or edema Skin: Skin color, texture, turgor normal. No rashes or lesions Lymph nodes: Cervical, supraclavicular, and axillary nodes normal. No abnormal inguinal nodes palpated Neurologic: Grossly normal   Pelvic: External genitalia:  no lesions              Urethra:  normal appearing urethra with no masses, tenderness or lesions              Bartholins and Skenes: normal                 Vagina: normal appearing vagina with normal color and discharge, no lesions              Cervix: no lesions              Pap taken: Yes.   Bimanual Exam:  Uterus:  normal size, contour, position, consistency, mobility, non-tender              Adnexa: normal adnexa and no mass, fullness, tenderness               Rectovaginal: Confirms               Anus:  normal sphincter tone, no lesions  Chaperone, Shela Nevin, CMA, was  present for exam.  Assessment/Plan: 1. Well woman exam with routine gynecological exam - pap obtained today with HR HPV - MMG 03/2020 - repeat BMD this fall.  Order placed - she will try and get me Tdap date.  Aware pneumovax due after birthday - she thinks last coloscopy was 2016.  She will look for records.  Cannot remember provider  2. Osteopenia of necks of both femurs - DG BONE DENSITY (DXA); Future  3. Vaginal atrophy - OTC and treatment options discussed.  She does not want to use any estrogens due to side effect.

## 2020-09-17 NOTE — Patient Instructions (Addendum)
Replens Coconut oil Vit E vaginal suppositories or cream Hyaluronic acid/Bonafide  Can you get date of your last tetanus?  Can you get me a copy of your colonoscopy or who was the provider who did it?

## 2020-09-18 LAB — CYTOLOGY - PAP
Comment: NEGATIVE
Diagnosis: NEGATIVE
High risk HPV: NEGATIVE

## 2020-10-01 ENCOUNTER — Encounter (HOSPITAL_BASED_OUTPATIENT_CLINIC_OR_DEPARTMENT_OTHER): Payer: Self-pay

## 2021-02-26 ENCOUNTER — Other Ambulatory Visit: Payer: Self-pay | Admitting: Family Medicine

## 2021-02-26 DIAGNOSIS — Z1231 Encounter for screening mammogram for malignant neoplasm of breast: Secondary | ICD-10-CM

## 2021-04-01 ENCOUNTER — Ambulatory Visit
Admission: RE | Admit: 2021-04-01 | Discharge: 2021-04-01 | Disposition: A | Discharge status: Home / Self Care | Payer: No Typology Code available for payment source | Source: Ambulatory Visit | Attending: Family Medicine | Admitting: Family Medicine

## 2021-04-01 ENCOUNTER — Other Ambulatory Visit: Payer: Self-pay | Admitting: Obstetrics & Gynecology

## 2021-04-01 ENCOUNTER — Ambulatory Visit
Admission: RE | Admit: 2021-04-01 | Discharge: 2021-04-01 | Disposition: A | Discharge status: Home / Self Care | Payer: No Typology Code available for payment source | Source: Ambulatory Visit | Attending: Obstetrics & Gynecology | Admitting: Obstetrics & Gynecology

## 2021-04-01 ENCOUNTER — Other Ambulatory Visit: Payer: Self-pay

## 2021-04-01 DIAGNOSIS — M85852 Other specified disorders of bone density and structure, left thigh: Secondary | ICD-10-CM

## 2021-04-01 DIAGNOSIS — Z1231 Encounter for screening mammogram for malignant neoplasm of breast: Secondary | ICD-10-CM

## 2021-04-01 DIAGNOSIS — M85851 Other specified disorders of bone density and structure, right thigh: Secondary | ICD-10-CM

## 2021-10-30 ENCOUNTER — Encounter (HOSPITAL_BASED_OUTPATIENT_CLINIC_OR_DEPARTMENT_OTHER): Payer: Self-pay | Admitting: Obstetrics & Gynecology

## 2021-10-30 ENCOUNTER — Ambulatory Visit (INDEPENDENT_AMBULATORY_CARE_PROVIDER_SITE_OTHER): Payer: No Typology Code available for payment source | Admitting: Obstetrics & Gynecology

## 2021-10-30 VITALS — BP 132/57 | HR 88 | Ht 62.0 in | Wt 136.6 lb

## 2021-10-30 DIAGNOSIS — M8580 Other specified disorders of bone density and structure, unspecified site: Secondary | ICD-10-CM | POA: Diagnosis not present

## 2021-10-30 DIAGNOSIS — Z780 Asymptomatic menopausal state: Secondary | ICD-10-CM | POA: Diagnosis not present

## 2021-10-30 DIAGNOSIS — E785 Hyperlipidemia, unspecified: Secondary | ICD-10-CM | POA: Diagnosis not present

## 2021-10-30 DIAGNOSIS — Z01419 Encounter for gynecological examination (general) (routine) without abnormal findings: Secondary | ICD-10-CM

## 2021-10-30 DIAGNOSIS — Z808 Family history of malignant neoplasm of other organs or systems: Secondary | ICD-10-CM | POA: Insufficient documentation

## 2021-10-30 NOTE — Progress Notes (Addendum)
66 y.o. G2P2 Married White or Caucasian female here for annual exam.  Doing well.  Denies vaginal bleeding.  Using Bonafide.  Was using topical estrogen but she felt a lot of bloating with this.    Patient's last menstrual period was 08/16/2011.          Sexually active: Yes.    The current method of family planning is post menopausal status.    Exercising: Yes.    Smoker:  no  Health Maintenance: Pap:  09/17/2020 Negative History of abnormal Pap:  no MMG:  04/01/2021 Negative Colonoscopy:  05/25/2018, with [facility] BMD:   04/01/2021 Osteopenia Screening Labs: Dr. Alyson Ingles   reports that she has never smoked. She has never used smokeless tobacco. She reports current alcohol use. She reports that she does not use drugs.  Past Medical History:  Diagnosis Date   Allergy    seasonal   Cancer ([facility])    skin on face and back   High cholesterol    Hypertension    Osteopenia 04/2018   T score -2.1 FRAX 10% / 1.5%    Past Surgical History:  Procedure Laterality Date   CERVICAL BIOPSY  W/ LOOP ELECTRODE EXCISION  2001   For nabothian cyst NOT abnormal Pap smear   CESAREAN SECTION     excision of skin cancer  2004 and 2005   face and back   TONSILLECTOMY      Current Outpatient Medications  Medication Sig Dispense Refill   amLODipine (NORVASC) 10 MG tablet Take 1 tablet by mouth daily.     aspirin 81 MG tablet Take 81 mg by mouth daily.     Azelastine HCl 137 MCG/SPRAY SOLN SMARTSIG:1-2 Spray(s) Both Nares Twice Daily PRN     BIOTIN PO Take 1 tablet by mouth daily.     Calcium Carb-Cholecalciferol (CALCIUM 600 + D PO) Take by mouth.     celecoxib (CELEBREX) 200 MG capsule Take 200 mg by mouth daily.     losartan-hydrochlorothiazide (HYZAAR) 100-25 MG per tablet Take 1 tablet by mouth daily.     Multiple Vitamin (MULTIVITAMIN WITH MINERALS) TABS tablet Take 1 tablet by mouth daily.     pravastatin (PRAVACHOL) 40 MG tablet Take 40 mg by mouth daily.     No current  facility-administered medications for this visit.    Family History  Problem Relation Age of Onset   Hypertension Father    Heart disease Father    Hyperlipidemia Father    Glaucoma Mother    Dementia Mother    Hyperlipidemia Brother    Colon cancer Neg Hx    Breast cancer Neg Hx     Genitourinary:negative  Exam:   BP (!) 132/57 (BP Location: Left Arm, Patient Position: Sitting, Cuff Size: Large)   Pulse 88   Ht '5\' 2"'$  (1.575 m) Comment: reported  Wt 136 lb 9.6 oz (62 kg)   LMP 08/16/2011   BMI 24.98 kg/m   Height: '5\' 2"'$  (157.5 cm) (reported)  General appearance: alert, cooperative and appears stated age Head: Normocephalic, without obvious abnormality, atraumatic Neck: no adenopathy, supple, symmetrical, trachea midline and thyroid normal to inspection and palpation Lungs: clear to auscultation bilaterally Breasts: normal appearance, no masses or tenderness Heart: regular rate and rhythm Abdomen: soft, non-tender; bowel sounds normal; no masses,  no organomegaly Extremities: extremities normal, atraumatic, no cyanosis or edema Skin: Skin color, texture, turgor normal. No rashes or lesions Lymph nodes: Cervical, supraclavicular, and axillary nodes normal. No abnormal inguinal  nodes palpated Neurologic: Grossly normal   Pelvic: External genitalia:  no lesions              Urethra:  normal appearing urethra with no masses, tenderness or lesions              Bartholins and Skenes: normal                 Vagina: normal appearing vagina with normal color and no discharge, no lesions              Cervix: present              Pap taken: no Bimanual Exam:  Uterus:  non enlarged, mobile, non tender              Adnexa: no mass, fullness, tenderness               Rectovaginal: Confirms               Anus:  normal sphincter tone, no lesions  Chaperone, Octaviano Batty, CMA, was present for exam.  Assessment/Plan: 1. Well woman exam with routine gynecological exam - pap with  neg HR HPV obtained in 2022 - MMG up to date - colonoscopy 2019 [facility] - BMD done 2022 - lab work done with Dr. Alyson Ingles - vaccines reviewed.  Pt is going to try and get updates for me.  2. Postmenopausal - no HRT - using bonafide for vaginal dryness  3. Elevated lipids - on treatment  4. Osteopenia, unspecified location - BMD 202

## 2021-10-30 NOTE — Patient Instructions (Signed)
Can you check about dates for your tetanus shots and shingles vaccines.  Can you send me these dates, I can updated the health maintenance in this electronic medical record.  Thanks. ?

## 2021-12-04 ENCOUNTER — Other Ambulatory Visit (HOSPITAL_BASED_OUTPATIENT_CLINIC_OR_DEPARTMENT_OTHER): Payer: Self-pay | Admitting: Obstetrics & Gynecology

## 2021-12-04 ENCOUNTER — Encounter (HOSPITAL_BASED_OUTPATIENT_CLINIC_OR_DEPARTMENT_OTHER): Payer: Self-pay | Admitting: Obstetrics & Gynecology

## 2022-03-05 ENCOUNTER — Other Ambulatory Visit: Payer: Self-pay | Admitting: Obstetrics & Gynecology

## 2022-03-05 DIAGNOSIS — Z1231 Encounter for screening mammogram for malignant neoplasm of breast: Secondary | ICD-10-CM

## 2022-04-09 ENCOUNTER — Ambulatory Visit
Admission: RE | Admit: 2022-04-09 | Discharge: 2022-04-09 | Disposition: A | Discharge status: Home / Self Care | Payer: 59 | Source: Ambulatory Visit | Attending: Obstetrics & Gynecology | Admitting: Obstetrics & Gynecology

## 2022-04-09 DIAGNOSIS — Z1231 Encounter for screening mammogram for malignant neoplasm of breast: Secondary | ICD-10-CM

## 2022-05-21 ENCOUNTER — Other Ambulatory Visit: Payer: Self-pay | Admitting: Internal Medicine

## 2022-05-21 DIAGNOSIS — E785 Hyperlipidemia, unspecified: Secondary | ICD-10-CM

## 2022-06-27 ENCOUNTER — Encounter: Payer: Self-pay | Admitting: Internal Medicine

## 2022-07-01 ENCOUNTER — Other Ambulatory Visit: Payer: 59

## 2022-07-28 ENCOUNTER — Ambulatory Visit
Admission: RE | Admit: 2022-07-28 | Discharge: 2022-07-28 | Disposition: A | Discharge status: Home / Self Care | Payer: No Typology Code available for payment source | Source: Ambulatory Visit | Attending: Internal Medicine | Admitting: Internal Medicine

## 2022-07-28 DIAGNOSIS — E785 Hyperlipidemia, unspecified: Secondary | ICD-10-CM

## 2022-11-03 ENCOUNTER — Ambulatory Visit (HOSPITAL_BASED_OUTPATIENT_CLINIC_OR_DEPARTMENT_OTHER): Payer: No Typology Code available for payment source | Admitting: Obstetrics & Gynecology

## 2023-01-15 ENCOUNTER — Ambulatory Visit (HOSPITAL_BASED_OUTPATIENT_CLINIC_OR_DEPARTMENT_OTHER): Payer: 59 | Admitting: Obstetrics & Gynecology

## 2023-01-27 ENCOUNTER — Ambulatory Visit (INDEPENDENT_AMBULATORY_CARE_PROVIDER_SITE_OTHER): Payer: BC Managed Care – PPO | Admitting: Obstetrics & Gynecology

## 2023-01-27 ENCOUNTER — Encounter (HOSPITAL_BASED_OUTPATIENT_CLINIC_OR_DEPARTMENT_OTHER): Payer: Self-pay | Admitting: Obstetrics & Gynecology

## 2023-01-27 VITALS — BP 130/60 | HR 72 | Ht 62.0 in | Wt 122.8 lb

## 2023-01-27 DIAGNOSIS — M533 Sacrococcygeal disorders, not elsewhere classified: Secondary | ICD-10-CM | POA: Diagnosis not present

## 2023-01-27 DIAGNOSIS — Z01419 Encounter for gynecological examination (general) (routine) without abnormal findings: Secondary | ICD-10-CM

## 2023-01-27 DIAGNOSIS — I10 Essential (primary) hypertension: Secondary | ICD-10-CM

## 2023-01-27 DIAGNOSIS — Z780 Asymptomatic menopausal state: Secondary | ICD-10-CM

## 2023-01-27 DIAGNOSIS — Z808 Family history of malignant neoplasm of other organs or systems: Secondary | ICD-10-CM | POA: Diagnosis not present

## 2023-01-27 DIAGNOSIS — E785 Hyperlipidemia, unspecified: Secondary | ICD-10-CM

## 2023-01-27 NOTE — Progress Notes (Signed)
67 y.o. G2P2 Married White or Caucasian female here for annual exam.  Had basal cell carcinoma removed with Mohs surgery in late July.  Done at [facility].  Doing well.  Father had several medical issues this past year.  He has cardiac arrest, ICU admission, gall bladder removal, then sepsis from the drain that was placed.  He then fell and had a head bleeding.  This has been very stressful for her.  She report she's had hair loss which she thinks is related.  She has lost some weight as well.  She's lost about 15 pounds.  She is experiencing some burning pina around her coccyx.  There is some sharp pain with sitting and then there is a burning sensation.  She did have an x ray with Dr. Nadene Rubins, her PCP.  Arthritis was present.  Does use celebrex from time to time and this helps a little bit.  Has had some diarrhea issues with all the stressors.  Will be seeing Dr. Marca Ancona at [facility] for this.  Has been seen at [facility] in the past.    Denies vaginal bleeding.  Using revaree suppositories vaginally.    Patient's last menstrual period was 08/16/2011.          Sexually active: Yes.    H/O STD:  no  Health Maintenance: PCP:  Dr. Nadene Rubins.  Last wellness appt was May/June of this year.  Did blood work at that appt:  yes Vaccines are up to date:  yes Colonoscopy:  05/25/2018 with [facility].  Follow up recommended 5 years. MMG:  04/09/2022 Negative BMD:  04/01/2021 Osteopenia Last pap smear:  09/17/2020 Negative.   H/o abnormal pap smear:  no    reports that she has never smoked. She has never used smokeless tobacco. She reports current alcohol use. She reports that she does not use drugs.  Past Medical History:  Diagnosis Date   Allergy    seasonal   Cancer ([facility])    skin on face and back   High cholesterol    Hypertension    Osteopenia 04/2018   T score -2.1 FRAX 10% / 1.5%    Past Surgical History:  Procedure Laterality Date   CERVICAL BIOPSY  W/ LOOP ELECTRODE EXCISION  2001    For nabothian cyst NOT abnormal Pap smear   CESAREAN SECTION     excision of skin cancer  2004 and 2005   face and back   TONSILLECTOMY      Current Outpatient Medications  Medication Sig Dispense Refill   amLODipine (NORVASC) 10 MG tablet Take 1 tablet by mouth daily.     Azelastine HCl 137 MCG/SPRAY SOLN SMARTSIG:1-2 Spray(s) Both Nares Twice Daily PRN     Calcium Carb-Cholecalciferol (CALCIUM 600 + D PO) Take by mouth.     celecoxib (CELEBREX) 200 MG capsule Take 200 mg by mouth daily.     doxycycline (ADOXA) 100 MG tablet Take 100 mg by mouth daily. Prn     losartan-hydrochlorothiazide (HYZAAR) 100-25 MG per tablet Take 1 tablet by mouth daily.     minoxidil (ROGAINE) 2 % external solution Apply topically 2 (two) times daily.     Multiple Vitamin (MULTIVITAMIN WITH MINERALS) TABS tablet Take 1 tablet by mouth daily.     rosuvastatin (CRESTOR) 10 MG tablet Take 10 mg by mouth daily.     No current facility-administered medications for this visit.    Family History  Problem Relation Age of Onset   Hypertension Father  Heart disease Father    Hyperlipidemia Father    Glaucoma Mother    Dementia Mother    Hyperlipidemia Brother    Colon cancer Neg Hx    Breast cancer Neg Hx     Review of Systems  Constitutional: Negative.   Genitourinary: Negative.     Exam:   BP 130/60 (BP Location: Right Arm, Patient Position: Sitting, Cuff Size: Normal)   Pulse 72   Ht 5\' 2"  (1.575 m) Comment: Reported  Wt 122 lb 12.8 oz (55.7 kg)   LMP 08/16/2011   BMI 22.46 kg/m   Height: 5\' 2"  (157.5 cm) (Reported)  General appearance: alert, cooperative and appears stated age Breasts: normal appearance, no masses or tenderness Abdomen: soft, non-tender; bowel sounds normal; no masses,  no organomegaly Lymph nodes: Cervical, supraclavicular, and axillary nodes normal.  No abnormal inguinal nodes palpated Neurologic: Grossly normal  Pelvic: External genitalia:  no lesions               Urethra:  normal appearing urethra with no masses, tenderness or lesions              Bartholins and Skenes: normal                 Vagina: normal appearing vagina with atrophic changes and no discharge, no lesions              Cervix: no lesions              Pap taken: No. Bimanual Exam:  Uterus:  normal size, contour, position, consistency, mobility, non-tender              Adnexa: normal adnexa and no mass, fullness, tenderness               Rectovaginal: Confirms               Anus:  normal sphincter tone, no lesions  Chaperone, Ina Homes, CMA, was present for exam.  Assessment/Plan: 1. Well woman exam with routine gynecological exam - Pap smear 2022 - Mammogram 03/2022 - Colonoscopy 2019.  Follow up 5 years.  Pt is aware. - Bone mineral density 2022 with osteopenia.  Will plan to repeat 1-2 years - lab work done with PCP, Dr. Nadene Rubins - vaccines reviewed/updated  2. Postmenopausal - no ton HRT  3. Coccyx pain - I think this is partly due to her weight loss.  Will refer to Dr. Katrinka Blazing to see if anything other than anti-inflammatories would be helpful for treatment.  4. Family history of malignant melanoma - pt is followed by dermatology  5. Primary hypertension - followed by Dr. Nadene Rubins  6. Elevated lipids - followed by Dr. Nadene Rubins

## 2023-02-03 ENCOUNTER — Ambulatory Visit (INDEPENDENT_AMBULATORY_CARE_PROVIDER_SITE_OTHER): Payer: BC Managed Care – PPO

## 2023-02-03 ENCOUNTER — Encounter: Payer: Self-pay | Admitting: Family Medicine

## 2023-02-03 ENCOUNTER — Ambulatory Visit: Payer: BC Managed Care – PPO | Admitting: Family Medicine

## 2023-02-03 VITALS — BP 126/68 | HR 64 | Ht 62.0 in | Wt 123.0 lb

## 2023-02-03 DIAGNOSIS — M533 Sacrococcygeal disorders, not elsewhere classified: Secondary | ICD-10-CM

## 2023-02-03 DIAGNOSIS — M47816 Spondylosis without myelopathy or radiculopathy, lumbar region: Secondary | ICD-10-CM | POA: Diagnosis not present

## 2023-02-03 DIAGNOSIS — M5136 Other intervertebral disc degeneration, lumbar region: Secondary | ICD-10-CM | POA: Diagnosis not present

## 2023-02-03 DIAGNOSIS — M25559 Pain in unspecified hip: Secondary | ICD-10-CM | POA: Diagnosis not present

## 2023-02-03 DIAGNOSIS — M549 Dorsalgia, unspecified: Secondary | ICD-10-CM | POA: Diagnosis not present

## 2023-02-03 MED ORDER — GABAPENTIN 100 MG PO CAPS: 200.0000 mg | ORAL_CAPSULE | Freq: Every day | ORAL | 0 refills | Status: DC

## 2023-02-03 NOTE — Assessment & Plan Note (Addendum)
Nontraumatic coccydynia noted.  Will get x-rays otherwise.  We discussed with patient to make sure that this does not have any association with bowel changes.  Discussed icing regimen and home exercises otherwise.  We discussed with patient about hip abductor strengthening and glutes strengthening.  Patient has lost weight and this likely has contributed to some of it.  Also having GI changes we discussed how iron deficiency could potentially be causing some discomfort and discussed 500 mg of vitamin C supplementation.  Depending on how patient responds to the conservative therapy we will discuss if formal physical therapy is necessary.  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  Follow-up with me again in 6 to 8 weeks otherwise.  Due to discomfort and difficulty sleeping gabapentin 200 mg given as well.

## 2023-02-03 NOTE — Progress Notes (Signed)
[name] [address] [place] [address] Phone: [phone] Subjective:   INadine Counts, am serving as a scribe for Dr. Antoine Primas.  I'm seeing this patient by the request  of:  Melida Quitter, MD  CC: Coccyx pain  BJY:[id number]  Judy Arellano is a 67 y.o. female coming in with complaint of coccyx pain.  Over the course of time has had some weight loss.  Patient states pain has been on and off. Started around end of Feb. Can produce pain when sitting. Not every times. A little tingling in lower legs. Sometimes a burning sensation that stays in one area. Celebrex and tylenol together will help ease pain, but spot is tender and causes acute pain when hit.   Reviewing patient's chart did have a CT calcium score showing the patient was in the 91 percentile Bone density in 2022 showed patient was osteopenic with a -2 point one of the left femur neck   Medications patient has been as prescribed including Celebrex.  Past Medical History:  Diagnosis Date   Allergy    seasonal   Cancer (HCC)    skin on face and back   High cholesterol    Hypertension    Osteopenia 04/2018   T score -2.1 FRAX 10% / 1.5%   Past Surgical History:  Procedure Laterality Date   CERVICAL BIOPSY  W/ LOOP ELECTRODE EXCISION  2001   For nabothian cyst NOT abnormal Pap smear   CESAREAN SECTION     excision of skin cancer  2004 and 2005   face and back   TONSILLECTOMY     Social History   Socioeconomic History   Marital status: Married    Spouse name: Not on file   Number of children: Not on file   Years of education: Not on file   Highest education level: Not on file  Occupational History   Not on file  Tobacco Use   Smoking status: Never   Smokeless tobacco: Never  Vaping Use   Vaping status: Never Used  Substance and Sexual Activity   Alcohol use: Yes    Alcohol/week: 0.0 standard drinks of alcohol    Comment: very little - occassionally   Drug  use: No   Sexual activity: Yes    Birth control/protection: Post-menopausal, Surgical    Comment: vasectomy-1st intercourse 21 yo-1 partner  Other Topics Concern   Not on file  Social History Narrative   Not on file   Social Determinants of Health   Financial Resource Strain: Not on file  Food Insecurity: Not on file  Transportation Needs: Not on file  Physical Activity: Not on file  Stress: Not on file  Social Connections: Not on file   No Known Allergies Family History  Problem Relation Age of Onset   Hypertension Father    Heart disease Father    Hyperlipidemia Father    Glaucoma Mother    Dementia Mother    Hyperlipidemia Brother    Colon cancer Neg Hx    Breast cancer Neg Hx      Current Outpatient Medications (Cardiovascular):    amLODipine (NORVASC) 10 MG tablet, Take 1 tablet by mouth daily.   losartan-hydrochlorothiazide (HYZAAR) 100-25 MG per tablet, Take 1 tablet by mouth daily.   rosuvastatin (CRESTOR) 10 MG tablet, Take 10 mg by mouth daily.  Current Outpatient Medications (Respiratory):    Azelastine HCl 137 MCG/SPRAY SOLN, SMARTSIG:1-2 Spray(s) Both Nares Twice Daily PRN  Current Outpatient Medications (Analgesics):    celecoxib (CELEBREX) 200 MG capsule, Take 200 mg by mouth daily.   Current Outpatient Medications (Other):    gabapentin (NEURONTIN) 100 MG capsule, Take 2 capsules (200 mg total) by mouth at bedtime.   Calcium Carb-Cholecalciferol (CALCIUM 600 + D PO), Take by mouth.   doxycycline (ADOXA) 100 MG tablet, Take 100 mg by mouth daily. Prn   minoxidil (ROGAINE) 2 % external solution, Apply topically 2 (two) times daily.   Multiple Vitamin (MULTIVITAMIN WITH MINERALS) TABS tablet, Take 1 tablet by mouth daily.   Reviewed prior external information including notes and imaging from  primary care provider As well as notes that were available from care everywhere and other healthcare systems.  Past medical history, social, surgical and  family history all reviewed in electronic medical record.  No pertanent information unless stated regarding to the chief complaint.   Review of Systems:  No headache, visual changes, nausea, vomiting, diarrhea, constipation, dizziness, abdominal pain, skin rash, fevers, chills, night sweats, weight loss, swollen lymph nodes, body aches, joint swelling, chest pain, shortness of breath, mood changes. POSITIVE muscle aches  Objective  Blood pressure 126/68, pulse 64, height 5\' 2"  (1.575 m), weight 123 lb (55.8 kg), last menstrual period 08/16/2011, SpO2 96%.   General: No apparent distress alert and oriented x3 mood and affect normal, dressed appropriately.  HEENT: Pupils equal, extraocular movements intact  Respiratory: Patient's speak in full sentences and does not appear short of breath  Cardiovascular: No lower extremity edema, non tender, no erythema  Patient low back does have some mild loss of lordosis.  Hypermobility noted otherwise.  Negative FABER test noted.  Tenderness to palpation of the paraspinal musculature.  Patient is tender over the coccyx bone itself.  No abnormality noted though.  5 out of 5 strength the lower extremities noted.  Deep tendon reflexes are intact    Impression and Recommendations:    The above documentation has been reviewed and is accurate and complete Judi Saa, DO

## 2023-02-03 NOTE — Patient Instructions (Addendum)
Xrays today Gabapentin 200mg  Do prescribed exercises at least 3x a week See you again in 6-8 weeks

## 2023-02-11 DIAGNOSIS — K30 Functional dyspepsia: Secondary | ICD-10-CM | POA: Diagnosis not present

## 2023-02-11 DIAGNOSIS — R6881 Early satiety: Secondary | ICD-10-CM | POA: Diagnosis not present

## 2023-02-11 DIAGNOSIS — R197 Diarrhea, unspecified: Secondary | ICD-10-CM | POA: Diagnosis not present

## 2023-02-11 DIAGNOSIS — Z86010 Personal history of colonic polyps: Secondary | ICD-10-CM | POA: Diagnosis not present

## 2023-02-24 DIAGNOSIS — K644 Residual hemorrhoidal skin tags: Secondary | ICD-10-CM | POA: Diagnosis not present

## 2023-02-24 DIAGNOSIS — R6881 Early satiety: Secondary | ICD-10-CM | POA: Diagnosis not present

## 2023-02-24 DIAGNOSIS — K5730 Diverticulosis of large intestine without perforation or abscess without bleeding: Secondary | ICD-10-CM | POA: Diagnosis not present

## 2023-02-24 DIAGNOSIS — K648 Other hemorrhoids: Secondary | ICD-10-CM | POA: Diagnosis not present

## 2023-02-24 DIAGNOSIS — Z86010 Personal history of colonic polyps: Secondary | ICD-10-CM | POA: Diagnosis not present

## 2023-02-24 DIAGNOSIS — K2970 Gastritis, unspecified, without bleeding: Secondary | ICD-10-CM | POA: Diagnosis not present

## 2023-02-24 DIAGNOSIS — K2289 Other specified disease of esophagus: Secondary | ICD-10-CM | POA: Diagnosis not present

## 2023-02-24 DIAGNOSIS — K229 Disease of esophagus, unspecified: Secondary | ICD-10-CM | POA: Diagnosis not present

## 2023-02-24 DIAGNOSIS — D122 Benign neoplasm of ascending colon: Secondary | ICD-10-CM | POA: Diagnosis not present

## 2023-02-24 DIAGNOSIS — K6389 Other specified diseases of intestine: Secondary | ICD-10-CM | POA: Diagnosis not present

## 2023-02-24 DIAGNOSIS — D128 Benign neoplasm of rectum: Secondary | ICD-10-CM | POA: Diagnosis not present

## 2023-02-24 DIAGNOSIS — K2930 Chronic superficial gastritis without bleeding: Secondary | ICD-10-CM | POA: Diagnosis not present

## 2023-02-27 ENCOUNTER — Other Ambulatory Visit: Payer: Self-pay | Admitting: Gastroenterology

## 2023-02-27 ENCOUNTER — Ambulatory Visit
Admission: RE | Admit: 2023-02-27 | Discharge: 2023-02-27 | Disposition: A | Discharge status: Home / Self Care | Payer: BC Managed Care – PPO | Source: Ambulatory Visit | Attending: Gastroenterology | Admitting: Gastroenterology

## 2023-02-27 DIAGNOSIS — R109 Unspecified abdominal pain: Secondary | ICD-10-CM

## 2023-02-27 DIAGNOSIS — R140 Abdominal distension (gaseous): Secondary | ICD-10-CM | POA: Diagnosis not present

## 2023-03-13 ENCOUNTER — Other Ambulatory Visit: Payer: Self-pay | Admitting: Obstetrics & Gynecology

## 2023-03-13 DIAGNOSIS — Z1231 Encounter for screening mammogram for malignant neoplasm of breast: Secondary | ICD-10-CM

## 2023-03-13 NOTE — Progress Notes (Unsigned)
[name] [address] [place] [address] Phone: [phone] Subjective:   Judy Arellano, am serving as a scribe for Dr. Antoine Primas.  I'm seeing this patient by the request  of:  Judy Quitter, MD  CC: Low back and coccyx pain  [id number]  02/03/2023 Nontraumatic coccydynia noted.  Will get x-rays otherwise.  We discussed with patient to make sure that this does not have any association with bowel changes.  Discussed icing regimen and home exercises otherwise.  We discussed with patient about hip abductor strengthening and glutes strengthening.  Patient has lost weight and this likely has contributed to some of it.  Also having GI changes we discussed how iron deficiency could potentially be causing some discomfort and discussed 500 mg of vitamin C supplementation.  Depending on how patient responds to the conservative therapy we will discuss if formal physical therapy is necessary.  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  Follow-up with me again in 6 to 8 weeks otherwise.  Due to discomfort and difficulty sleeping gabapentin 200 mg given as well.     Updated 03/17/2023 Judy Arellano is a 67 y.o. female coming in with complaint of coccyx pain. Patient states that she is the same as last visit. Pain with sitting. 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 Burning pain when standing has dissipated. Has been walking more recently.   X-rays do not show any significant bony abnormality but does have some underlying arthritic changes noted.     Past Medical History:  Diagnosis Date   Allergy    seasonal   Cancer (HCC)    skin on face and back   High cholesterol    Hypertension    Osteopenia 04/2018   T score -2.1 FRAX 10% / 1.5%   Past Surgical History:  Procedure Laterality Date   CERVICAL BIOPSY  W/ LOOP ELECTRODE EXCISION  2001    For nabothian cyst NOT abnormal Pap smear   CESAREAN SECTION     excision of skin cancer  2004 and 2005   face and back   TONSILLECTOMY     Social History   Socioeconomic History   Marital status: Married    Spouse name: Not on file   Number of children: Not on file   Years of education: Not on file   Highest education level: Not on file  Occupational History   Not on file  Tobacco Use   Smoking status: Never   Smokeless tobacco: Never  Vaping Use   Vaping status: Never Used  Substance and Sexual Activity   Alcohol use: Yes    Alcohol/week: 0.0 standard drinks of alcohol    Comment: very little - occassionally   Drug use: No   Sexual activity: Yes    Birth control/protection: Post-menopausal, Surgical    Comment: vasectomy-1st intercourse 67 yo-1 partner  Other Topics Concern   Not on file  Social History Narrative   Not on file   Social Determinants of Health   Financial Resource Strain: Not on file  Food Insecurity: Not on file  Transportation Needs: Not on file  Physical Activity: Not on file  Stress: Not on file  Social Connections: Not on file   No Known Allergies Family History  Problem Relation Age of Onset   Hypertension Father    Heart disease Father  Hyperlipidemia Father    Glaucoma Mother    Dementia Mother    Hyperlipidemia Brother    Colon cancer Neg Hx    Breast cancer Neg Hx     Current Outpatient Medications (Endocrine & Metabolic):    predniSONE (DELTASONE) 20 MG tablet, Take 2 tablets (40 mg total) by mouth daily with breakfast.  Current Outpatient Medications (Cardiovascular):    amLODipine (NORVASC) 10 MG tablet, Take 1 tablet by mouth daily.   losartan-hydrochlorothiazide (HYZAAR) 100-25 MG per tablet, Take 1 tablet by mouth daily.   rosuvastatin (CRESTOR) 10 MG tablet, Take 10 mg by mouth daily.  Current Outpatient Medications (Respiratory):    Azelastine HCl 137 MCG/SPRAY SOLN, SMARTSIG:1-2 Spray(s) Both Nares Twice Daily  PRN  Current Outpatient Medications (Analgesics):    celecoxib (CELEBREX) 200 MG capsule, Take 200 mg by mouth daily.   Current Outpatient Medications (Other):    Calcium Carb-Cholecalciferol (CALCIUM 600 + D PO), Take by mouth.   doxycycline (ADOXA) 100 MG tablet, Take 100 mg by mouth daily. Prn   gabapentin (NEURONTIN) 100 MG capsule, Take 2 capsules (200 mg total) by mouth at bedtime.   minoxidil (ROGAINE) 2 % external solution, Apply topically 2 (two) times daily.   Multiple Vitamin (MULTIVITAMIN WITH MINERALS) TABS tablet, Take 1 tablet by mouth daily.   Reviewed prior external information including notes and imaging from  primary care provider As well as notes that were available from care everywhere and other healthcare systems.  Past medical history, social, surgical and family history all reviewed in electronic medical record.  No pertanent information unless stated regarding to the chief complaint.   Review of Systems:  No headache, visual changes, nausea, vomiting, diarrhea, constipation, dizziness, abdominal pain, skin rash, fevers, chills, night sweats, weight loss, swollen lymph nodes, body aches, joint swelling, chest pain, shortness of breath, mood changes. POSITIVE muscle aches  Objective  Blood pressure 128/72, pulse 78, height 5\' 2"  (1.575 m), weight 124 lb (56.2 kg), last menstrual period 08/16/2011, SpO2 97%.   General: No apparent distress alert and oriented x3 mood and affect normal, dressed appropriately.  HEENT: Pupils equal, extraocular movements intact  Respiratory: Patient's speak in full sentences and does not appear short of breath  Cardiovascular: No lower extremity edema, non tender, no erythema  Low back exam shows patient does have some loss of lordosis noted.  Some tenderness to palpation in the paraspinal musculature. No specific pain though over the coccyx pain today.   Impression and Recommendations:     The above documentation has been  reviewed and is accurate and complete Judi Saa, DO

## 2023-03-17 ENCOUNTER — Ambulatory Visit: Payer: BC Managed Care – PPO | Admitting: Family Medicine

## 2023-03-17 ENCOUNTER — Encounter: Payer: Self-pay | Admitting: Family Medicine

## 2023-03-17 VITALS — BP 128/72 | HR 78 | Ht 62.0 in | Wt 124.0 lb

## 2023-03-17 DIAGNOSIS — M533 Sacrococcygeal disorders, not elsewhere classified: Secondary | ICD-10-CM | POA: Diagnosis not present

## 2023-03-17 MED ORDER — PREDNISONE 20 MG PO TABS: 40.0000 mg | ORAL_TABLET | Freq: Every day | ORAL | 0 refills | Status: DC

## 2023-03-17 NOTE — Patient Instructions (Signed)
Prednisone 40mg  for 5 days Read about Effexor Give [place] an update in 5-7 days so we know how you're doing See you again in 6-8 weeks

## 2023-03-17 NOTE — Assessment & Plan Note (Signed)
Discussed with patient not making significant changes.  Recently did have a colonoscopy and still awaiting the pathology.  Do not feel though that there is any masses in the area.  Discussed with patient that not we will try a short course of prednisone to see if this works well.  Discussed potential Effexor as well with patient having some difficulty.  Patient does not know if the gabapentin is helpful or not but may take it for nighttime.  Follow-up with me again in 6 to 8 weeks otherwise.

## 2023-03-24 ENCOUNTER — Telehealth: Payer: Self-pay | Admitting: Family Medicine

## 2023-03-24 NOTE — Telephone Encounter (Signed)
Pt seen 03/17/2023, was advised to update [place] in a few days. As of today, pt has NO change in pain/tingling/burning. Unsure what next steps are.

## 2023-03-26 NOTE — Telephone Encounter (Signed)
Sent patient MyChart message with recommendations.  

## 2023-04-01 ENCOUNTER — Other Ambulatory Visit: Payer: Self-pay

## 2023-04-01 DIAGNOSIS — M533 Sacrococcygeal disorders, not elsewhere classified: Secondary | ICD-10-CM

## 2023-04-17 ENCOUNTER — Ambulatory Visit
Admission: RE | Admit: 2023-04-17 | Discharge: 2023-04-17 | Disposition: A | Discharge status: Home / Self Care | Payer: BC Managed Care – PPO | Source: Ambulatory Visit | Attending: Obstetrics & Gynecology | Admitting: Obstetrics & Gynecology

## 2023-04-17 DIAGNOSIS — Z1231 Encounter for screening mammogram for malignant neoplasm of breast: Secondary | ICD-10-CM

## 2023-04-28 ENCOUNTER — Ambulatory Visit
Admission: RE | Admit: 2023-04-28 | Discharge: 2023-04-28 | Disposition: A | Discharge status: Home / Self Care | Payer: BC Managed Care – PPO | Source: Ambulatory Visit | Attending: Family Medicine | Admitting: Family Medicine

## 2023-04-28 ENCOUNTER — Ambulatory Visit: Payer: BC Managed Care – PPO | Admitting: Family Medicine

## 2023-04-28 DIAGNOSIS — M7601 Gluteal tendinitis, right hip: Secondary | ICD-10-CM | POA: Diagnosis not present

## 2023-04-28 DIAGNOSIS — M533 Sacrococcygeal disorders, not elsewhere classified: Secondary | ICD-10-CM | POA: Diagnosis not present

## 2023-04-28 DIAGNOSIS — M1611 Unilateral primary osteoarthritis, right hip: Secondary | ICD-10-CM | POA: Diagnosis not present

## 2023-04-28 DIAGNOSIS — M7602 Gluteal tendinitis, left hip: Secondary | ICD-10-CM | POA: Diagnosis not present

## 2023-04-28 NOTE — Progress Notes (Unsigned)
[facility] [address] [place] [address] Phone: [phone] Subjective:   Judy Arellano, am serving as a scribe for Dr. Antoine Primas.  I'm seeing this patient by the request  of:  Melida Quitter, MD  CC: Coccygeal pain  [id number]  03/17/2023 Discussed with patient not making significant changes.  Recently did have a colonoscopy and still awaiting the pathology.  Do not feel though that there is any masses in the area.  Discussed with patient that not we will try a short course of prednisone to see if this works well.  Discussed potential Effexor as well with patient having some difficulty.  Patient does not know if the gabapentin is helpful or not but may take it for nighttime.  Follow-up with me again in 6 to 8 weeks otherwise.     Updated 04/29/2023 Judy Arellano is a 67 y.o. female coming in with complaint of coccyx pain. Everything about the same. No new concerns.        Past Medical History:  Diagnosis Date   Allergy    seasonal   Cancer (HCC)    skin on face and back   High cholesterol    Hypertension    Osteopenia 04/2018   T score -2.1 FRAX 10% / 1.5%   Past Surgical History:  Procedure Laterality Date   CERVICAL BIOPSY  W/ LOOP ELECTRODE EXCISION  2001   For nabothian cyst NOT abnormal Pap smear   CESAREAN SECTION     excision of skin cancer  2004 and 2005   face and back   TONSILLECTOMY     Social History   Socioeconomic History   Marital status: Married    Spouse name: Not on file   Number of children: Not on file   Years of education: Not on file   Highest education level: Not on file  Occupational History   Not on file  Tobacco Use   Smoking status: Never   Smokeless tobacco: Never  Vaping Use   Vaping status: Never Used  Substance and Sexual Activity   Alcohol use: Yes    Alcohol/week: 0.0 standard drinks of alcohol    Comment: very little - occassionally   Drug use: No   Sexual activity: Yes     Birth control/protection: Post-menopausal, Surgical    Comment: vasectomy-1st intercourse 21 yo-1 partner  Other Topics Concern   Not on file  Social History Narrative   Not on file   Social Determinants of Health   Financial Resource Strain: Not on file  Food Insecurity: Not on file  Transportation Needs: Not on file  Physical Activity: Not on file  Stress: Not on file  Social Connections: Not on file   No Known Allergies Family History  Problem Relation Age of Onset   Glaucoma Mother    Dementia Mother    Hypertension Father    Heart disease Father    Hyperlipidemia Father    Hyperlipidemia Brother    Colon cancer Neg Hx    Breast cancer Neg Hx    BRCA 1/2 Neg Hx     Current Outpatient Medications (Endocrine & Metabolic):    predniSONE (DELTASONE) 20 MG tablet, Take 2 tablets (40 mg total) by mouth daily with breakfast.  Current Outpatient Medications (Cardiovascular):    amLODipine (NORVASC) 10 MG tablet, Take 1 tablet by mouth daily.   losartan-hydrochlorothiazide (HYZAAR) 100-25 MG per tablet, Take 1 tablet by mouth daily.   rosuvastatin (  CRESTOR) 10 MG tablet, Take 10 mg by mouth daily.  Current Outpatient Medications (Respiratory):    Azelastine HCl 137 MCG/SPRAY SOLN, SMARTSIG:1-2 Spray(s) Both Nares Twice Daily PRN (Patient not taking: Reported on 04/29/2023)  Current Outpatient Medications (Analgesics):    celecoxib (CELEBREX) 200 MG capsule, Take 200 mg by mouth daily.   Current Outpatient Medications (Other):    Calcium Carb-Cholecalciferol (CALCIUM 600 + D PO), Take by mouth.   doxycycline (ADOXA) 100 MG tablet, Take 100 mg by mouth daily. Prn   gabapentin (NEURONTIN) 100 MG capsule, Take 2 capsules (200 mg total) by mouth at bedtime.   minoxidil (ROGAINE) 2 % external solution, Apply topically 2 (two) times daily.   Multiple Vitamin (MULTIVITAMIN WITH MINERALS) TABS tablet, Take 1 tablet by mouth daily.     Objective  Blood pressure 122/66,  pulse 91, height 5\' 2"  (1.575 m), weight 125 lb (56.7 kg), last menstrual period 08/16/2011, SpO2 95%.   General: No apparent distress alert and oriented x3 mood and affect normal, dressed appropriately.  HEENT: Pupils equal, extraocular movements intact  Respiratory: Patient's speak in full sentences and does not appear short of breath  Cardiovascular: No lower extremity edema, non tender, no erythema  Back exam still tender to palpation more over the coccygeal area.  Does have some mild limitation in extension noted.  No atrophy of the lower extremities.  Neurovascular intact distally.    Impression and Recommendations:    The above documentation has been reviewed and is accurate and complete Judi Saa, DO

## 2023-04-29 ENCOUNTER — Ambulatory Visit: Payer: BC Managed Care – PPO | Admitting: Family Medicine

## 2023-04-29 VITALS — BP 122/66 | HR 91 | Ht 62.0 in | Wt 125.0 lb

## 2023-04-29 DIAGNOSIS — M2550 Pain in unspecified joint: Secondary | ICD-10-CM

## 2023-04-29 DIAGNOSIS — M533 Sacrococcygeal disorders, not elsewhere classified: Secondary | ICD-10-CM

## 2023-04-29 LAB — CK: Total CK: 58 U/L (ref 7–177)

## 2023-04-29 LAB — CBC WITH DIFFERENTIAL/PLATELET
Basophils Absolute: 0 10*3/uL (ref 0.0–0.1)
Basophils Relative: 0.5 % (ref 0.0–3.0)
Eosinophils Absolute: 0.1 10*3/uL (ref 0.0–0.7)
Eosinophils Relative: 1.2 % (ref 0.0–5.0)
HCT: 42.5 % (ref 36.0–46.0)
Hemoglobin: 14.4 g/dL (ref 12.0–15.0)
Lymphocytes Relative: 24.3 % (ref 12.0–46.0)
Lymphs Abs: 1.6 10*3/uL (ref 0.7–4.0)
MCHC: 33.9 g/dL (ref 30.0–36.0)
MCV: 87.4 fL (ref 78.0–100.0)
Monocytes Absolute: 0.5 10*3/uL (ref 0.1–1.0)
Monocytes Relative: 7 % (ref 3.0–12.0)
Neutro Abs: 4.5 10*3/uL (ref 1.4–7.7)
Neutrophils Relative %: 67 % (ref 43.0–77.0)
Platelets: 232 10*3/uL (ref 150.0–400.0)
RBC: 4.87 Mil/uL (ref 3.87–5.11)
RDW: 13.7 % (ref 11.5–15.5)
WBC: 6.7 10*3/uL (ref 4.0–10.5)

## 2023-04-29 LAB — SEDIMENTATION RATE: Sed Rate: 5 mm/h (ref 0–30)

## 2023-04-29 LAB — COMPREHENSIVE METABOLIC PANEL
ALT: 15 U/L (ref 0–35)
AST: 16 U/L (ref 0–37)
Albumin: 4.4 g/dL (ref 3.5–5.2)
Alkaline Phosphatase: 74 U/L (ref 39–117)
BUN: 19 mg/dL (ref 6–23)
CO2: 32 meq/L (ref 19–32)
Calcium: 9.8 mg/dL (ref 8.4–10.5)
Chloride: 101 meq/L (ref 96–112)
Creatinine, Ser: 0.74 mg/dL (ref 0.40–1.20)
GFR: 84.09 mL/min (ref >60.00)
Glucose, Bld: 109 mg/dL — ABNORMAL HIGH (ref 70–99)
Potassium: 3.7 meq/L (ref 3.5–5.1)
Sodium: 140 meq/L (ref 135–145)
Total Bilirubin: 0.4 mg/dL (ref 0.2–1.2)
Total Protein: 6.7 g/dL (ref 6.0–8.3)

## 2023-04-29 LAB — URIC ACID: Uric Acid, Serum: 4.8 mg/dL (ref 2.4–7.0)

## 2023-04-29 LAB — IBC PANEL
Iron: 75 ug/dL (ref 42–145)
Saturation Ratios: 24.5 % (ref 20.0–50.0)
TIBC: 306.6 ug/dL (ref 250.0–450.0)
Transferrin: 219 mg/dL (ref 212.0–360.0)

## 2023-04-29 LAB — VITAMIN D 25 HYDROXY (VIT D DEFICIENCY, FRACTURES): VITD: 55.31 ng/mL (ref 30.00–100.00)

## 2023-04-29 LAB — VITAMIN B12: Vitamin B-12: 870 pg/mL (ref 211–911)

## 2023-04-29 LAB — FERRITIN: Ferritin: 70.8 ng/mL (ref 10.0–291.0)

## 2023-04-29 NOTE — Patient Instructions (Addendum)
Labs today See you again in 6-8 weeks

## 2023-04-30 LAB — ANA: Anti Nuclear Antibody (ANA): NEGATIVE

## 2023-04-30 LAB — PTH, INTACT AND CALCIUM
Calcium: 9.6 mg/dL (ref 8.6–10.4)
PTH: 24 pg/mL (ref 16–77)

## 2023-04-30 NOTE — Assessment & Plan Note (Addendum)
Patient does still have the pain at this time.  Awaiting the read of the MRI.  Will see if there is anything that could be potentially helpful for patient's symptoms.  We discussed topical anti-inflammatory we will see how patient responds and then depending on this we will make changes appropriately based on the imaging.  Labs ordered today as well to make sure nothing else is potentially contributing

## 2023-05-21 DIAGNOSIS — I10 Essential (primary) hypertension: Secondary | ICD-10-CM | POA: Diagnosis not present

## 2023-06-15 DIAGNOSIS — H43813 Vitreous degeneration, bilateral: Secondary | ICD-10-CM | POA: Diagnosis not present

## 2023-06-15 DIAGNOSIS — H0288A Meibomian gland dysfunction right eye, upper and lower eyelids: Secondary | ICD-10-CM | POA: Diagnosis not present

## 2023-06-15 DIAGNOSIS — H2513 Age-related nuclear cataract, bilateral: Secondary | ICD-10-CM | POA: Diagnosis not present

## 2023-06-15 DIAGNOSIS — H0288B Meibomian gland dysfunction left eye, upper and lower eyelids: Secondary | ICD-10-CM | POA: Diagnosis not present

## 2023-06-24 NOTE — Progress Notes (Signed)
 [facility] [address] [place] [address] Phone: [phone] Subjective:   LILLETTE Berwyn Posey, am serving as a scribe for Dr. Arthea [facility].  I'm seeing this patient by the request  of:  Stephane Leita DEL, MD  CC: Coccyx pain follow-up  [id number]  04/29/2023 Patient does still have the pain at this time.  Awaiting the read of the MRI.  Will see if there is anything that could be potentially helpful for patient's symptoms.  We discussed topical anti-inflammatory we will see how patient responds and then depending on this we will make changes appropriately based on the imaging.  Labs ordered today as well to make sure nothing else is potentially contributing     Update 06/26/2023 Judy Arellano is a 68 y.o. female coming in with complaint of coccyx pain. Patient states that her pain is the same as last visit. Pain has been burning in character. Painful to sit on hard seat and to bath.     Patient did have an MRI of the pelvis done November 26.  This was independently visualized by me and shown the patient is some moderate arthritic changes of the sacroiliac joint bilaterally.  Mild right hip arthritis noted.  Moderate tendinosis of the gluteal tendons bilaterally.  Does have a 10 mm bone lesion within the anterior medial right acetabulum that was concerning for potential OCD and may need further imaging.  Recommendation was to repeat in May 2025 Laboratory workup did not show any significant abnormalities. Past Medical History:  Diagnosis Date   Allergy    seasonal   Cancer (HCC)    skin on face and back   High cholesterol    Hypertension    Osteopenia 04/2018   T score -2.1 FRAX 10% / 1.5%   Past Surgical History:  Procedure Laterality Date   CERVICAL BIOPSY  W/ LOOP ELECTRODE EXCISION  2001   For nabothian cyst NOT abnormal Pap smear   CESAREAN SECTION     excision of skin cancer  2004 and 2005   face and back   TONSILLECTOMY     Social  History   Socioeconomic History   Marital status: Married    Spouse name: Not on file   Number of children: Not on file   Years of education: Not on file   Highest education level: Not on file  Occupational History   Not on file  Tobacco Use   Smoking status: Never   Smokeless tobacco: Never  Vaping Use   Vaping status: Never Used  Substance and Sexual Activity   Alcohol  use: Yes    Alcohol /week: 0.0 standard drinks of alcohol     Comment: very little - occassionally   Drug use: No   Sexual activity: Yes    Birth control/protection: Post-menopausal, Surgical    Comment: vasectomy-1st intercourse 21 yo-1 partner  Other Topics Concern   Not on file  Social History Narrative   Not on file   Social Drivers of Health   Financial Resource Strain: Not on file  Food Insecurity: Not on file  Transportation Needs: Not on file  Physical Activity: Not on file  Stress: Not on file  Social Connections: Not on file   No Known Allergies Family History  Problem Relation Age of Onset   Glaucoma Mother    Dementia Mother    Hypertension Father    Heart disease Father    Hyperlipidemia Father    Hyperlipidemia Brother  Colon cancer Neg Hx    Breast cancer Neg Hx    BRCA 1/2 Neg Hx     Current Outpatient Medications (Endocrine & Metabolic):    predniSONE  (DELTASONE ) 20 MG tablet, Take 2 tablets (40 mg total) by mouth daily with breakfast.  Current Outpatient Medications (Cardiovascular):    amLODipine (NORVASC) 10 MG tablet, Take 1 tablet by mouth daily.   losartan-hydrochlorothiazide (HYZAAR) 100-25 MG per tablet, Take 1 tablet by mouth daily.   rosuvastatin (CRESTOR) 10 MG tablet, Take 10 mg by mouth daily.  Current Outpatient Medications (Respiratory):    Azelastine HCl 137 MCG/SPRAY SOLN, SMARTSIG:1-2 Spray(s) Both Nares Twice Daily PRN (Patient not taking: Reported on 04/29/2023)  Current Outpatient Medications (Analgesics):    celecoxib (CELEBREX) 200 MG capsule, Take  200 mg by mouth daily.   Current Outpatient Medications (Other):    Calcium Carb-Cholecalciferol (CALCIUM 600 + D PO), Take by mouth.   doxycycline (ADOXA) 100 MG tablet, Take 100 mg by mouth daily. Prn   gabapentin  (NEURONTIN ) 100 MG capsule, Take 2 capsules (200 mg total) by mouth at bedtime.   minoxidil (ROGAINE) 2 % external solution, Apply topically 2 (two) times daily.   Multiple Vitamin (MULTIVITAMIN WITH MINERALS) TABS tablet, Take 1 tablet by mouth daily.   Reviewed prior external information including notes and imaging from  primary care provider As well as notes that were available from care everywhere and other healthcare systems.  Past medical history, social, surgical and family history all reviewed in electronic medical record.  No pertanent information unless stated regarding to the chief complaint.   Review of Systems:  No headache, visual changes, nausea, vomiting, diarrhea, constipation, dizziness, abdominal pain, skin rash, fevers, chills, night sweats, weight loss, swollen lymph nodes, body aches, joint swelling, chest pain, shortness of breath, mood changes. POSITIVE muscle aches  Objective  Last menstrual period 08/16/2011.   General: No apparent distress alert and oriented x3 mood and affect normal, dressed appropriately.  HEENT: Pupils equal, extraocular movements intact  Respiratory: Patient's speak in full sentences and does not appear short of breath  Cardiovascular: No lower extremity edema, non tender, no erythema  Hip exam shows relatively good range of motion.  Sitting comfortably.  Did not do the coccyx area with patient feeling relatively decent today.    Impression and Recommendations:    The above documentation has been reviewed and is accurate and complete Gerlean Cid M Hurshel Bouillon, DO

## 2023-06-26 ENCOUNTER — Ambulatory Visit: Payer: BC Managed Care – PPO | Admitting: Family Medicine

## 2023-06-26 VITALS — BP 128/76 | HR 66 | Ht 62.0 in | Wt 127.0 lb

## 2023-06-26 DIAGNOSIS — M533 Sacrococcygeal disorders, not elsewhere classified: Secondary | ICD-10-CM

## 2023-06-26 DIAGNOSIS — Q6589 Other specified congenital deformities of hip: Secondary | ICD-10-CM

## 2023-06-26 DIAGNOSIS — M899 Disorder of bone, unspecified: Secondary | ICD-10-CM | POA: Diagnosis not present

## 2023-06-26 NOTE — Patient Instructions (Signed)
 Good to see you Consider the 3 things Cymbalta SI jt injections Coccygeal nerve block (I'll give you Valium) Please write with questions We will wait to hear from you

## 2023-06-27 ENCOUNTER — Encounter: Payer: Self-pay | Admitting: Family Medicine

## 2023-06-27 DIAGNOSIS — Q6589 Other specified congenital deformities of hip: Secondary | ICD-10-CM | POA: Insufficient documentation

## 2023-06-27 NOTE — Assessment & Plan Note (Signed)
 Talk to patient at great length.  Did not find anything specific that is giving her her coccydynia.  Did have some inflammation though of the sacroiliac joints we stated with moderate arthritic changes.  We discussed the potential for injections in the sacroiliac joints, medications including the Cymbalta  or Effexor which I think could be potentially beneficial for some of the pain.  We also discussed today coccygeal block if she would like to do that.  Patient did not make any decisions at this exam but would take all the information and consider it at home.  Continuing to monitor otherwise.  Follow-up with me again when she decides what type of treatment options or if she has any other questions.  Total time reviewing patient's imaging of the MRI today as well as discussing with patient 33 minutes

## 2023-06-27 NOTE — Assessment & Plan Note (Signed)
 Abnormal finding on exam.  Questionable OCD.  Recommendation is to have a repeat imaging in May.

## 2023-07-02 ENCOUNTER — Other Ambulatory Visit: Payer: Self-pay

## 2023-07-02 ENCOUNTER — Encounter: Payer: Self-pay | Admitting: Family Medicine

## 2023-07-02 MED ORDER — DULOXETINE HCL 20 MG PO CPEP: 20.0000 mg | ORAL_CAPSULE | Freq: Every day | ORAL | 0 refills | Status: DC

## 2023-07-29 ENCOUNTER — Other Ambulatory Visit: Payer: Self-pay | Admitting: Family Medicine

## 2023-08-28 ENCOUNTER — Other Ambulatory Visit: Payer: Self-pay | Admitting: Family Medicine

## 2023-09-04 DIAGNOSIS — D485 Neoplasm of uncertain behavior of skin: Secondary | ICD-10-CM | POA: Diagnosis not present

## 2023-09-04 DIAGNOSIS — D044 Carcinoma in situ of skin of scalp and neck: Secondary | ICD-10-CM | POA: Diagnosis not present

## 2023-09-04 DIAGNOSIS — L814 Other melanin hyperpigmentation: Secondary | ICD-10-CM | POA: Diagnosis not present

## 2023-09-04 DIAGNOSIS — L659 Nonscarring hair loss, unspecified: Secondary | ICD-10-CM | POA: Diagnosis not present

## 2023-09-04 DIAGNOSIS — L738 Other specified follicular disorders: Secondary | ICD-10-CM | POA: Diagnosis not present

## 2023-09-04 DIAGNOSIS — L578 Other skin changes due to chronic exposure to nonionizing radiation: Secondary | ICD-10-CM | POA: Diagnosis not present

## 2023-09-28 ENCOUNTER — Encounter: Payer: Self-pay | Admitting: Family Medicine

## 2023-09-29 DIAGNOSIS — C4442 Squamous cell carcinoma of skin of scalp and neck: Secondary | ICD-10-CM | POA: Diagnosis not present

## 2023-09-30 ENCOUNTER — Other Ambulatory Visit: Payer: Self-pay

## 2023-09-30 MED ORDER — DULOXETINE HCL 30 MG PO CPEP: 30.0000 mg | ORAL_CAPSULE | Freq: Every day | ORAL | 0 refills | Status: DC

## 2023-10-02 ENCOUNTER — Encounter: Payer: Self-pay | Admitting: Family Medicine

## 2023-10-26 ENCOUNTER — Telehealth: Payer: Self-pay | Admitting: Family Medicine

## 2023-10-26 NOTE — Telephone Encounter (Signed)
 Patient called stating that she has been taking Cymbatla 30mg  and has 4 pills left but it is not helping the pain in her tailbone.  She asked what next steps would be?

## 2023-10-27 ENCOUNTER — Ambulatory Visit
Admission: RE | Admit: 2023-10-27 | Discharge: 2023-10-27 | Disposition: A | Discharge status: Home / Self Care | Payer: BC Managed Care – PPO | Source: Ambulatory Visit | Attending: Family Medicine | Admitting: Family Medicine

## 2023-10-27 DIAGNOSIS — M160 Bilateral primary osteoarthritis of hip: Secondary | ICD-10-CM | POA: Diagnosis not present

## 2023-10-27 DIAGNOSIS — M899 Disorder of bone, unspecified: Secondary | ICD-10-CM

## 2023-10-27 DIAGNOSIS — D259 Leiomyoma of uterus, unspecified: Secondary | ICD-10-CM | POA: Diagnosis not present

## 2023-10-27 DIAGNOSIS — M533 Sacrococcygeal disorders, not elsewhere classified: Secondary | ICD-10-CM | POA: Diagnosis not present

## 2023-10-27 MED ORDER — GADOPICLENOL 0.5 MMOL/ML IV SOLN
5.0000 mL | Freq: Once | INTRAVENOUS | Status: AC | PRN
  Administered 2023-10-27: 5 mL via INTRAVENOUS

## 2023-10-27 NOTE — Telephone Encounter (Signed)
 Can we tell her either increase to 40 mg or can discontinue and would need to consider another medicine

## 2023-10-28 ENCOUNTER — Other Ambulatory Visit: Payer: Self-pay

## 2023-10-28 MED ORDER — DULOXETINE HCL 20 MG PO CPEP: 40.0000 mg | ORAL_CAPSULE | Freq: Every day | ORAL | 0 refills | Status: DC

## 2023-11-23 ENCOUNTER — Ambulatory Visit: Payer: Self-pay | Admitting: Family Medicine

## 2023-12-17 DIAGNOSIS — E785 Hyperlipidemia, unspecified: Secondary | ICD-10-CM | POA: Diagnosis not present

## 2023-12-22 DIAGNOSIS — Z23 Encounter for immunization: Secondary | ICD-10-CM | POA: Diagnosis not present

## 2023-12-22 DIAGNOSIS — Z1339 Encounter for screening examination for other mental health and behavioral disorders: Secondary | ICD-10-CM | POA: Diagnosis not present

## 2023-12-22 DIAGNOSIS — Z1331 Encounter for screening for depression: Secondary | ICD-10-CM | POA: Diagnosis not present

## 2023-12-22 DIAGNOSIS — Z0000 Encounter for general adult medical examination without abnormal findings: Secondary | ICD-10-CM | POA: Diagnosis not present

## 2023-12-22 DIAGNOSIS — I10 Essential (primary) hypertension: Secondary | ICD-10-CM | POA: Diagnosis not present

## 2024-02-19 ENCOUNTER — Encounter (HOSPITAL_BASED_OUTPATIENT_CLINIC_OR_DEPARTMENT_OTHER): Payer: Self-pay | Admitting: Obstetrics & Gynecology

## 2024-02-19 ENCOUNTER — Ambulatory Visit (INDEPENDENT_AMBULATORY_CARE_PROVIDER_SITE_OTHER): Admitting: Obstetrics & Gynecology

## 2024-02-19 VITALS — BP 128/58 | HR 71 | Wt 128.2 lb

## 2024-02-19 DIAGNOSIS — Z780 Asymptomatic menopausal state: Secondary | ICD-10-CM | POA: Diagnosis not present

## 2024-02-19 DIAGNOSIS — N952 Postmenopausal atrophic vaginitis: Secondary | ICD-10-CM | POA: Diagnosis not present

## 2024-02-19 DIAGNOSIS — Z01419 Encounter for gynecological examination (general) (routine) without abnormal findings: Secondary | ICD-10-CM

## 2024-02-19 DIAGNOSIS — M85851 Other specified disorders of bone density and structure, right thigh: Secondary | ICD-10-CM

## 2024-02-19 DIAGNOSIS — M85852 Other specified disorders of bone density and structure, left thigh: Secondary | ICD-10-CM

## 2024-02-19 DIAGNOSIS — Z1231 Encounter for screening mammogram for malignant neoplasm of breast: Secondary | ICD-10-CM

## 2024-02-19 DIAGNOSIS — Z124 Encounter for screening for malignant neoplasm of cervix: Secondary | ICD-10-CM

## 2024-02-19 MED ORDER — ESTRADIOL 0.1 MG/GM VA CREA: TOPICAL_CREAM | VAGINAL | 3 refills | Status: AC

## 2024-02-19 NOTE — Patient Instructions (Addendum)
 Call [phone] to schedule an appointment at [facility].  You can schedule the mammogram and bone density.  You can place coconut oil vaginal twice weekly. Replense vaginal moisturizer.  OTC. Vit E vaginal suppositories.  Two to three times weekly.  Amazon.  I can compound this with [facility].   Revaree Hyaluronic acid vaginal suppositories.   Vit E and Hyaluronic acid Gynetrof on Amazon  Estradiol  vaginal cream -- prescription and just let me know if you want to try this.

## 2024-02-19 NOTE — Progress Notes (Deleted)
   ANNUAL EXAM Patient name: Judy Arellano MRN [id number]  Date of birth: 1956/01/01 Chief Complaint:   Annual Exam  History of Present Illness:   Judy Arellano is a 68 y.o. G2P2 Caucasian female being seen today for a routine annual exam.  Current complaints: ***  Patient's last menstrual period was 08/16/2011.   The pregnancy intention screening data noted above was reviewed. Potential methods of contraception were discussed. The patient elected to proceed with No data recorded.   Last pap 09/17/2020. Results were: NILM w/ HRHPV negative. H/O abnormal pap: {yes/yes***/no:23866} Last mammogram: ***. Results were: {normal, abnormal, n/a:23837}. Family h/o breast cancer: {yes***/no:23838} Last colonoscopy: ***. Results were: {normal, abnormal, n/a:23837}. Family h/o colorectal cancer: {yes***/no:23838}     01/27/2023   10:24 AM 10/30/2021    9:00 AM 09/17/2020   10:33 AM  Depression screen PHQ 2/9  Decreased Interest 0 0 0  Down, Depressed, Hopeless 0 0 0  PHQ - 2 Score 0 0 0         No data to display           Review of Systems:   Pertinent items are noted in HPI Denies any headaches, blurred vision, fatigue, shortness of breath, chest pain, abdominal pain, abnormal vaginal discharge/itching/odor/irritation, problems with periods, bowel movements, urination, or intercourse unless otherwise stated above. Pertinent History Reviewed:  Reviewed past medical,surgical, social and family history.  Reviewed problem list, medications and allergies. Physical Assessment:  There were no vitals filed for this visit.There is no height or weight on file to calculate BMI.        Physical Examination:   General appearance - well appearing, and in no distress  Mental status - alert, oriented to person, place, and time  Psych:  She has a normal mood and affect  Skin - warm and dry, normal color, no suspicious lesions noted  Chest - effort normal, all lung fields clear to auscultation  bilaterally  Heart - normal rate and regular rhythm  Neck:  midline trachea, no thyromegaly or nodules  Breasts - breasts appear normal, no suspicious masses, no skin or nipple changes or  axillary nodes  Abdomen - soft, nontender, nondistended, no masses or organomegaly  Pelvic - VULVA: normal appearing vulva with no masses, tenderness or lesions  VAGINA: normal appearing vagina with normal color and discharge, no lesions  CERVIX: normal appearing cervix without discharge or lesions, no CMT  Thin prep pap is {Desc; done/not:10129} *** HR HPV cotesting  UTERUS: uterus is felt to be normal size, shape, consistency and nontender   ADNEXA: No adnexal masses or tenderness noted.  Rectal - normal rectal, good sphincter tone, no masses felt. Hemoccult: ***  Extremities:  No swelling or varicosities noted  Chaperone present for exam  No results found for this or any previous visit (from the past 24 hours).  Assessment & Plan:  1) Well-Woman Exam  2) ***  Labs/procedures today: ***  Mammogram: {Mammo f/u:25212::@ 68yo}, or sooner if problems Colonoscopy: {TCS f/u:25213::@ 68yo}, or sooner if problems  No orders of the defined types were placed in this encounter.   Meds: No orders of the defined types were placed in this encounter.   Follow-up: No follow-ups on file.  Sprague, Kaitlyn E, RN 02/19/2024 11:06 AM

## 2024-02-19 NOTE — Progress Notes (Signed)
 ANNUAL EXAM Patient name: Judy Arellano MRN [id number]  Date of birth: 15-Jun-1956 Chief Complaint:   Annual Exam  History of Present Illness:   Judy Arellano is a 68 y.o. G2P2 Caucasian female being seen today for a routine annual exam.  Has lost both her parents the last year.  Had a lot of tailbone pain the last year.  She had a thorough evaluation and tried several medications.  Celebrex didn't help.  Gabapentin  didn't help.  Cymbalta  didn't really help.  She reports it is better.  She thought some weight loss that she had during the time when her parents passed may have contributed because some of her weight has returned and the pain is better.     Patient's last menstrual period was 08/16/2011.  Last pap 09/17/2020. Results were: NILM w/ HRHPV negative. H/O abnormal pap: no Last mammogram: 04/2024. Results were: normal. Family h/o breast cancer: no Last colonoscopy: 02/24/2023. Results were: normal. Family h/o colorectal cancer: no     01/27/2023   10:24 AM 10/30/2021    9:00 AM 09/17/2020   10:33 AM  Depression screen PHQ 2/9  Decreased Interest 0 0 0  Down, Depressed, Hopeless 0 0 0  PHQ - 2 Score 0 0 0     Review of Systems:   Pertinent items are noted in HPI Denies any headaches, blurred vision, fatigue, shortness of breath, chest pain, abdominal pain, abnormal vaginal discharge/itching/odor/irritation, problems with periods, bowel movements, urination, or intercourse unless otherwise stated above. Pertinent History Reviewed:  Reviewed past medical,surgical, social and family history.  Reviewed problem list, medications and allergies. Physical Assessment:   Vitals:   02/19/24 1130 02/19/24 1134  BP: 139/66 (!) 128/58  Pulse: 71   SpO2: 100%   Weight: 128 lb 3.2 oz (58.2 kg)   Body mass index is 23.45 kg/m.        Physical Examination:   General appearance - well appearing, and in no distress  Mental status - alert, oriented to person, place, and time  Psych:   She has a normal mood and affect  Skin - warm and dry, normal color, no suspicious lesions noted  Chest - effort normal, all lung fields clear to auscultation bilaterally  Heart - normal rate and regular rhythm  Neck:  midline trachea, no thyromegaly or nodules  Breasts - breasts appear normal, no suspicious masses, no skin or nipple changes or  axillary nodes  Abdomen - soft, nontender, nondistended, no masses or organomegaly  Pelvic - VULVA: normal appearing vulva with no masses, tenderness or lesions   VAGINA: atrophic changes, midline cystocele,   CERVIX: normal appearing cervix without discharge or lesions, no CMT  Thin prep pap is done without HR HPV cotesting  UTERUS: uterus is felt to be normal size, shape, consistency and nontender   ADNEXA: No adnexal masses or tenderness noted.  Rectal - normal rectal, good sphincter tone, no masses felt.   Extremities:  No swelling or varicosities noted  Chaperone present for exam  No results found for this or any previous visit (from the past 24 hours).  Assessment & Plan:  1. Well woman exam with routine gynecological exam (Primary) - Pap smear updated today - Mammogram 04/2024 - Colonoscopy 2024.  Follow up 10 years - Bone mineral density ordered - lab work done with PCP, Dr. Stephane - vaccines reviewed/updated  2. Postmenopausal  3. Osteopenia of necks of both femurs - DG Bone Density; Future  4. Encounter for screening  mammogram for malignant neoplasm of breast - MM 3D SCREENING MAMMOGRAM BILATERAL BREAST; Future  5. Vaginal atrophy - options discussed.  She is going to start vaginal estrogen cream.  Instructions given.  Will start with once weekly dosing. If no breast tenderness or pelvic cramping, she will increase tot twice weekly. - estradiol  (ESTRACE ) 0.1 MG/GM vaginal cream; 1 gram vaginally once or twice weekly  Dispense: 42.5 g; Refill: 3    Orders Placed This Encounter  Procedures   DG Bone Density   MM 3D SCREENING  MAMMOGRAM BILATERAL BREAST    Meds:  Meds ordered this encounter  Medications   estradiol  (ESTRACE ) 0.1 MG/GM vaginal cream    Sig: 1 gram vaginally once or twice weekly    Dispense:  42.5 g    Refill:  3    Follow-up: No follow-ups on file.  Ronal GORMAN Pinal, MD 02/19/2024 11:53 AM

## 2024-02-22 ENCOUNTER — Other Ambulatory Visit (HOSPITAL_COMMUNITY)
Admission: RE | Admit: 2024-02-22 | Discharge: 2024-02-22 | Disposition: A | Discharge status: Home / Self Care | Source: Ambulatory Visit | Attending: Obstetrics & Gynecology | Admitting: Obstetrics & Gynecology

## 2024-02-22 DIAGNOSIS — Z124 Encounter for screening for malignant neoplasm of cervix: Secondary | ICD-10-CM | POA: Insufficient documentation

## 2024-02-22 NOTE — Addendum Note (Signed)
 Addended by: CLEOTILDE RONAL RAMAN on: 02/22/2024 05:41 PM   Modules accepted: Orders

## 2024-02-24 ENCOUNTER — Ambulatory Visit (HOSPITAL_BASED_OUTPATIENT_CLINIC_OR_DEPARTMENT_OTHER): Payer: Self-pay | Admitting: Obstetrics & Gynecology

## 2024-02-24 LAB — CYTOLOGY - PAP: Diagnosis: NEGATIVE

## 2024-04-19 ENCOUNTER — Ambulatory Visit

## 2024-04-21 ENCOUNTER — Ambulatory Visit
Admission: RE | Admit: 2024-04-21 | Discharge: 2024-04-21 | Disposition: A | Source: Ambulatory Visit | Attending: Obstetrics & Gynecology | Admitting: Obstetrics & Gynecology

## 2024-04-21 DIAGNOSIS — Z1231 Encounter for screening mammogram for malignant neoplasm of breast: Secondary | ICD-10-CM | POA: Diagnosis not present

## 2024-05-04 DIAGNOSIS — H43813 Vitreous degeneration, bilateral: Secondary | ICD-10-CM | POA: Diagnosis not present

## 2024-05-04 DIAGNOSIS — H04123 Dry eye syndrome of bilateral lacrimal glands: Secondary | ICD-10-CM | POA: Diagnosis not present

## 2024-05-04 DIAGNOSIS — H0288A Meibomian gland dysfunction right eye, upper and lower eyelids: Secondary | ICD-10-CM | POA: Diagnosis not present

## 2024-05-04 DIAGNOSIS — H0288B Meibomian gland dysfunction left eye, upper and lower eyelids: Secondary | ICD-10-CM | POA: Diagnosis not present

## 2024-05-05 ENCOUNTER — Ambulatory Visit (HOSPITAL_BASED_OUTPATIENT_CLINIC_OR_DEPARTMENT_OTHER)
Admission: RE | Admit: 2024-05-05 | Discharge: 2024-05-05 | Disposition: A | Discharge status: Home / Self Care | Source: Ambulatory Visit | Attending: Obstetrics & Gynecology | Admitting: Obstetrics & Gynecology

## 2024-05-05 DIAGNOSIS — M810 Age-related osteoporosis without current pathological fracture: Secondary | ICD-10-CM | POA: Diagnosis not present

## 2024-05-05 DIAGNOSIS — M85851 Other specified disorders of bone density and structure, right thigh: Secondary | ICD-10-CM | POA: Diagnosis not present

## 2024-05-05 DIAGNOSIS — Z780 Asymptomatic menopausal state: Secondary | ICD-10-CM | POA: Diagnosis not present

## 2024-05-05 DIAGNOSIS — M85852 Other specified disorders of bone density and structure, left thigh: Secondary | ICD-10-CM | POA: Diagnosis not present

## 2024-05-11 ENCOUNTER — Other Ambulatory Visit (HOSPITAL_BASED_OUTPATIENT_CLINIC_OR_DEPARTMENT_OTHER): Payer: Self-pay

## 2024-05-11 DIAGNOSIS — M816 Localized osteoporosis [Lequesne]: Secondary | ICD-10-CM

## 2024-05-11 NOTE — Progress Notes (Signed)
 Called patient. DOB verified. Informed patient of results and recommendations. Patient would like to be referred to osteoporosis clinic. tbw

## 2024-05-19 ENCOUNTER — Ambulatory Visit: Admitting: Physician Assistant

## 2024-05-19 ENCOUNTER — Encounter: Payer: Self-pay | Admitting: Physician Assistant

## 2024-05-19 VITALS — Ht 61.75 in | Wt 130.0 lb

## 2024-05-19 DIAGNOSIS — M810 Age-related osteoporosis without current pathological fracture: Secondary | ICD-10-CM | POA: Diagnosis not present

## 2024-05-19 NOTE — Progress Notes (Signed)
 Office Visit Note   Patient: Judy Arellano           Date of Birth: Nov 11, 1955           MRN: [id number] Visit Date: 05/19/2024              Requested by: Cleotilde Ronal RAMAN, MD [address] [place],  [place] [address] PCP: Stephane Leita DEL, MD   Assessment & Plan: Visit Diagnoses:  1. Age-related osteoporosis without current pathological fracture     Plan: Patient is a very pleasant 68 year old woman who is referred by Dr. Cleotilde.  She is here for evaluation of osteoporosis.  She did recently have an updated bone density scan which placed her femoral neck at -2.6 previous scan was done a couple years ago and was in the osteopenia range.  It was done at a different scanner.  She has no history of fracture she is on calcium and vitamin D  and labs done recently are all normal.  She is not a smoker she does not consume alcohol  she takes weighted walking.  She has history of basal cell cancer but other denies no history of cancer no history of cardiac disease.  She does sometimes have some reflux though has not been formally diagnosed with this.  No history of kidney disease no history of epilepsy.  Her FRAX score was done today and she does have a history of apparent her mother who had a hip fracture in her 63s FRAX score was 4 .7% chance for hip fracture 24% for overall fracture in the next 10 years.  We talked about medications side effects and what I would recommend giving her young age I probably recommend Evenity.  She got information about this I reviewed everything in detail spent 45 minutes looking at her chart.  Her husband is a teacher, early years/pre she would like to review the information with him she will get back to me.  In the meantime she is gena try to get into a more focused resistance training program.  She will also continue to take 12 to 1500 mg of calcium a day  Follow-Up Instructions: No follow-ups on file.   Orders:  No orders of the defined types were placed in this  encounter.  No orders of the defined types were placed in this encounter.     Procedures: No procedures performed   Clinical Data: No additional findings.   Subjective: Chief Complaint  Patient presents with   Osteoporosis    HPI pleasant 68 year old woman comes in today for evaluation for osteoporosis referred by Dr. Cleotilde  Review of Systems  All other systems reviewed and are negative.    Objective: Vital Signs: Ht 5' 1.75 (1.568 m)   Wt 130 lb (59 kg)   LMP 08/16/2011   BMI 23.97 kg/m   Physical Exam Constitutional:      Appearance: Normal appearance.  Pulmonary:     Effort: Pulmonary effort is normal.  Skin:    General: Skin is warm and dry.  Neurological:     General: No focal deficit present.     Mental Status: She is alert and oriented to person, place, and time.  Psychiatric:        Mood and Affect: Mood normal.        Behavior: Behavior normal.       Specialty Comments:  No specialty comments available.  Imaging: No results found.   PMFS History: Patient Active Problem List  Diagnosis Date Noted   Age-related osteoporosis without current pathological fracture 05/19/2024   Acetabular dysplasia 06/27/2023   Nontraumatic coccydynia 02/03/2023   Family history of malignant melanoma 10/30/2021   Elevated lipids 10/30/2021   Primary hypertension 09/17/2020   Tinnitus, bilateral 09/20/2019   Past Medical History:  Diagnosis Date   Allergy    seasonal   Cancer (HCC)    skin on face and back   High cholesterol    Hypertension    Osteopenia 04/2018   T score -2.1 FRAX 10% / 1.5%    Family History  Problem Relation Age of Onset   Glaucoma Mother    Dementia Mother    Hypertension Father    Heart disease Father    Hyperlipidemia Father    Hyperlipidemia Brother    Colon cancer Neg Hx    Breast cancer Neg Hx    BRCA 1/2 Neg Hx     Past Surgical History:  Procedure Laterality Date   CERVICAL BIOPSY  W/ LOOP ELECTRODE  EXCISION  2001   For nabothian cyst NOT abnormal Pap smear   CESAREAN SECTION     excision of skin cancer  2004 and 2005   face and back   TONSILLECTOMY     Social History   Occupational History   Not on file  Tobacco Use   Smoking status: Never   Smokeless tobacco: Never  Vaping Use   Vaping status: Never Used  Substance and Sexual Activity   Alcohol  use: Yes    Alcohol /week: 0.0 standard drinks of alcohol     Comment: very little - occassionally   Drug use: No   Sexual activity: Yes    Birth control/protection: Post-menopausal, Surgical    Comment: vasectomy-1st intercourse 21 yo-1 partner

## 2025-02-23 ENCOUNTER — Ambulatory Visit (HOSPITAL_BASED_OUTPATIENT_CLINIC_OR_DEPARTMENT_OTHER): Admitting: Obstetrics & Gynecology
